# Patient Record
Sex: Female | Born: 1935 | Race: White | Hispanic: No | Smoking: Never smoker
Health system: Southern US, Community
[De-identification: ages and names within clinical notes are randomized; demographics above are authoritative.]

## PROBLEM LIST (undated history)

## (undated) DIAGNOSIS — H409 Unspecified glaucoma: Secondary | ICD-10-CM

## (undated) DIAGNOSIS — E782 Mixed hyperlipidemia: Secondary | ICD-10-CM

## (undated) DIAGNOSIS — R001 Bradycardia, unspecified: Secondary | ICD-10-CM

## (undated) DIAGNOSIS — G459 Transient cerebral ischemic attack, unspecified: Secondary | ICD-10-CM

## (undated) DIAGNOSIS — F0390 Unspecified dementia without behavioral disturbance: Secondary | ICD-10-CM

## (undated) HISTORY — PX: CHOLECYSTECTOMY: SHX55

## (undated) HISTORY — PX: ABDOMINAL HYSTERECTOMY: SHX81

## (undated) HISTORY — DX: Transient cerebral ischemic attack, unspecified: G45.9

---

## 1997-10-26 ENCOUNTER — Other Ambulatory Visit: Admission: RE | Admit: 1997-10-26 | Discharge: 1997-10-26 | Payer: Self-pay | Admitting: Internal Medicine

## 1998-07-19 ENCOUNTER — Ambulatory Visit (HOSPITAL_COMMUNITY): Admission: RE | Admit: 1998-07-19 | Discharge: 1998-07-19 | Payer: Self-pay | Admitting: Internal Medicine

## 1999-11-19 ENCOUNTER — Other Ambulatory Visit: Admission: RE | Admit: 1999-11-19 | Discharge: 1999-11-19 | Payer: Self-pay | Admitting: Internal Medicine

## 2002-02-03 ENCOUNTER — Other Ambulatory Visit: Admission: RE | Admit: 2002-02-03 | Discharge: 2002-02-03 | Payer: Self-pay | Admitting: Family Medicine

## 2004-03-12 ENCOUNTER — Other Ambulatory Visit: Admission: RE | Admit: 2004-03-12 | Discharge: 2004-03-12 | Payer: Self-pay | Admitting: Family Medicine

## 2005-03-18 ENCOUNTER — Other Ambulatory Visit: Admission: RE | Admit: 2005-03-18 | Discharge: 2005-03-18 | Payer: Self-pay | Admitting: Family Medicine

## 2011-03-11 ENCOUNTER — Other Ambulatory Visit (HOSPITAL_COMMUNITY): Payer: Self-pay | Admitting: Family Medicine

## 2011-03-11 DIAGNOSIS — Z1231 Encounter for screening mammogram for malignant neoplasm of breast: Secondary | ICD-10-CM

## 2011-03-13 ENCOUNTER — Other Ambulatory Visit (HOSPITAL_COMMUNITY): Payer: Self-pay | Admitting: Family Medicine

## 2011-03-13 DIAGNOSIS — Z1231 Encounter for screening mammogram for malignant neoplasm of breast: Secondary | ICD-10-CM

## 2011-03-22 ENCOUNTER — Other Ambulatory Visit (HOSPITAL_COMMUNITY): Payer: Self-pay | Admitting: Family Medicine

## 2011-03-22 ENCOUNTER — Ambulatory Visit (HOSPITAL_COMMUNITY)
Admission: RE | Admit: 2011-03-22 | Discharge: 2011-03-22 | Disposition: A | Discharge status: Home / Self Care | Payer: Medicare Other | Source: Ambulatory Visit | Attending: Family Medicine | Admitting: Family Medicine

## 2011-03-22 DIAGNOSIS — Z1231 Encounter for screening mammogram for malignant neoplasm of breast: Secondary | ICD-10-CM

## 2011-04-16 ENCOUNTER — Ambulatory Visit (HOSPITAL_COMMUNITY): Payer: Medicare Other

## 2011-04-18 ENCOUNTER — Ambulatory Visit (HOSPITAL_COMMUNITY): Payer: Medicare Other

## 2011-04-18 ENCOUNTER — Ambulatory Visit (HOSPITAL_COMMUNITY)
Admission: RE | Admit: 2011-04-18 | Discharge: 2011-04-18 | Disposition: A | Discharge status: Home / Self Care | Payer: Medicare Other | Source: Ambulatory Visit | Attending: Family Medicine | Admitting: Family Medicine

## 2011-04-18 DIAGNOSIS — Z1231 Encounter for screening mammogram for malignant neoplasm of breast: Secondary | ICD-10-CM | POA: Insufficient documentation

## 2011-05-01 ENCOUNTER — Other Ambulatory Visit: Payer: Self-pay | Admitting: Family Medicine

## 2011-05-01 DIAGNOSIS — R928 Other abnormal and inconclusive findings on diagnostic imaging of breast: Secondary | ICD-10-CM

## 2011-05-30 ENCOUNTER — Ambulatory Visit
Admission: RE | Admit: 2011-05-30 | Discharge: 2011-05-30 | Disposition: A | Discharge status: Home / Self Care | Payer: Medicare Other | Source: Ambulatory Visit | Attending: Family Medicine | Admitting: Family Medicine

## 2011-05-30 DIAGNOSIS — R928 Other abnormal and inconclusive findings on diagnostic imaging of breast: Secondary | ICD-10-CM

## 2012-01-03 ENCOUNTER — Other Ambulatory Visit: Payer: Self-pay | Admitting: Family Medicine

## 2012-01-03 DIAGNOSIS — R413 Other amnesia: Secondary | ICD-10-CM

## 2012-01-08 ENCOUNTER — Emergency Department (HOSPITAL_COMMUNITY)
Admission: EM | Admit: 2012-01-08 | Discharge: 2012-01-09 | Disposition: A | Discharge status: Other Acute Inpatient Hospital | Payer: Medicare Other | Attending: Emergency Medicine | Admitting: Emergency Medicine

## 2012-01-08 ENCOUNTER — Emergency Department (HOSPITAL_COMMUNITY): Payer: Medicare Other

## 2012-01-08 ENCOUNTER — Encounter (HOSPITAL_COMMUNITY): Payer: Self-pay

## 2012-01-08 ENCOUNTER — Ambulatory Visit
Admission: RE | Admit: 2012-01-08 | Discharge: 2012-01-08 | Disposition: A | Discharge status: Home / Self Care | Payer: Medicare Other | Source: Ambulatory Visit | Attending: Family Medicine | Admitting: Family Medicine

## 2012-01-08 DIAGNOSIS — G319 Degenerative disease of nervous system, unspecified: Secondary | ICD-10-CM | POA: Insufficient documentation

## 2012-01-08 DIAGNOSIS — R413 Other amnesia: Secondary | ICD-10-CM

## 2012-01-08 DIAGNOSIS — E7800 Pure hypercholesterolemia, unspecified: Secondary | ICD-10-CM | POA: Insufficient documentation

## 2012-01-08 DIAGNOSIS — R4689 Other symptoms and signs involving appearance and behavior: Secondary | ICD-10-CM

## 2012-01-08 DIAGNOSIS — F603 Borderline personality disorder: Secondary | ICD-10-CM | POA: Insufficient documentation

## 2012-01-08 DIAGNOSIS — Z9089 Acquired absence of other organs: Secondary | ICD-10-CM | POA: Insufficient documentation

## 2012-01-08 DIAGNOSIS — F0390 Unspecified dementia without behavioral disturbance: Secondary | ICD-10-CM | POA: Insufficient documentation

## 2012-01-08 HISTORY — DX: Unspecified glaucoma: H40.9

## 2012-01-08 HISTORY — DX: Unspecified dementia, unspecified severity, without behavioral disturbance, psychotic disturbance, mood disturbance, and anxiety: F03.90

## 2012-01-08 LAB — CBC WITH DIFFERENTIAL/PLATELET
Basophils Absolute: 0.1 10*3/uL (ref 0.0–0.1)
Lymphocytes Relative: 25 % (ref 12–46)
Lymphs Abs: 2.1 10*3/uL (ref 0.7–4.0)
Neutro Abs: 5.5 10*3/uL (ref 1.7–7.7)
Neutrophils Relative %: 64 % (ref 43–77)
Platelets: 286 10*3/uL (ref 150–400)
RBC: 5.01 MIL/uL (ref 3.87–5.11)
RDW: 13.6 % (ref 11.5–15.5)
WBC: 8.6 10*3/uL (ref 4.0–10.5)

## 2012-01-08 LAB — COMPREHENSIVE METABOLIC PANEL
ALT: 15 U/L (ref 0–35)
AST: 24 U/L (ref 0–37)
Alkaline Phosphatase: 82 U/L (ref 39–117)
CO2: 26 mEq/L (ref 19–32)
GFR calc Af Amer: 88 mL/min — ABNORMAL LOW (ref >90)
GFR calc non Af Amer: 76 mL/min — ABNORMAL LOW (ref >90)
Glucose, Bld: 106 mg/dL — ABNORMAL HIGH (ref 70–99)
Potassium: 4.2 mEq/L (ref 3.5–5.1)
Sodium: 138 mEq/L (ref 135–145)

## 2012-01-08 LAB — RAPID URINE DRUG SCREEN, HOSP PERFORMED
Amphetamines: NOT DETECTED
Barbiturates: NOT DETECTED
Cocaine: NOT DETECTED
Opiates: NOT DETECTED
Tetrahydrocannabinol: NOT DETECTED

## 2012-01-08 LAB — URINALYSIS, ROUTINE W REFLEX MICROSCOPIC
Bilirubin Urine: NEGATIVE
Hgb urine dipstick: NEGATIVE
Ketones, ur: NEGATIVE mg/dL
Nitrite: NEGATIVE
Specific Gravity, Urine: 1.018 (ref 1.005–1.030)
Urobilinogen, UA: 0.2 mg/dL (ref 0.0–1.0)
pH: 7.5 (ref 5.0–8.0)

## 2012-01-08 MED ORDER — ACETAMINOPHEN 325 MG PO TABS: 650.0000 mg | ORAL_TABLET | ORAL | Status: DC | PRN

## 2012-01-08 MED ORDER — ONDANSETRON HCL 4 MG PO TABS: 4.0000 mg | ORAL_TABLET | Freq: Three times a day (TID) | ORAL | Status: DC | PRN

## 2012-01-08 MED ORDER — ALUM & MAG HYDROXIDE-SIMETH 200-200-20 MG/5ML PO SUSP: 30.0000 mL | ORAL | Status: DC | PRN
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
## 2012-01-08 NOTE — Progress Notes (Signed)
Pt is still pending a tele-psych consult. CSW contacted SOC to review the wait time and was quoted another 1hr until it could be completed. CSW has updated the RN and the pt's family. ACT updated and will continue following at this time.

## 2012-01-08 NOTE — ED Notes (Signed)
Pt went to bathroom.  A hat was placed to catch specimen.  Pt urinated in toilet avoiding hat.  Pt returned to room and given more water.

## 2012-01-08 NOTE — ED Notes (Signed)
Patient transported to X-ray 

## 2012-01-08 NOTE — ED Notes (Addendum)
Patient returned from xray.
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
## 2012-01-08 NOTE — ED Notes (Signed)
Social work at bedside.  
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
## 2012-01-08 NOTE — ED Notes (Signed)
Hosmer MD allowed pt to eat and drink.

## 2012-01-09 ENCOUNTER — Emergency Department (HOSPITAL_COMMUNITY): Payer: Medicare Other

## 2012-01-09 MED ORDER — ATORVASTATIN CALCIUM 10 MG PO TABS
10.0000 mg | ORAL_TABLET | Freq: Every day | ORAL | Status: DC
  Administered 2012-01-09: 10 mg via ORAL
  Filled 2012-01-09: qty 1

## 2012-01-09 MED ORDER — QUETIAPINE FUMARATE 25 MG PO TABS
25.0000 mg | ORAL_TABLET | Freq: Every day | ORAL | Status: DC
  Administered 2012-01-09 (×2): 25 mg via ORAL
  Filled 2012-01-09 (×2): qty 1

## 2012-01-09 MED ORDER — LORAZEPAM 0.5 MG PO TABS: 0.5000 mg | ORAL_TABLET | Freq: Three times a day (TID) | ORAL | Status: DC

## 2012-01-09 MED ORDER — ADULT MULTIVITAMIN W/MINERALS CH
1.0000 | ORAL_TABLET | Freq: Every day | ORAL | Status: DC
  Administered 2012-01-09: 1 via ORAL
  Filled 2012-01-09: qty 1

## 2012-01-09 MED ORDER — OMEGA-3-ACID ETHYL ESTERS 1 G PO CAPS
2.0000 g | ORAL_CAPSULE | Freq: Every day | ORAL | Status: DC
  Administered 2012-01-09: 2 g via ORAL
  Filled 2012-01-09: qty 2

## 2012-01-09 MED ORDER — ZIPRASIDONE MESYLATE 20 MG IM SOLR
10.0000 mg | Freq: Once | INTRAMUSCULAR | Status: AC
  Administered 2012-01-09: 10 mg via INTRAMUSCULAR
  Filled 2012-01-09: qty 20

## 2012-01-09 MED ORDER — ALPRAZOLAM 0.25 MG PO TABS
0.2500 mg | ORAL_TABLET | Freq: Once | ORAL | Status: AC
  Administered 2012-01-09: 0.25 mg via ORAL
  Filled 2012-01-09: qty 1

## 2012-01-09 MED ORDER — GLUCOSAMINE 500 MG PO CAPS: 1.0000 | ORAL_CAPSULE | Freq: Every day | ORAL | Status: DC

## 2012-01-09 NOTE — ED Notes (Signed)
Dr. Rhunette Croft finally reached, informed of the situation, informed that chest xray has been ordered with permission with Charge RN. Dr. Rhunette Croft agreed to come and evaluate this patient.

## 2012-01-09 NOTE — Progress Notes (Signed)
PHARMACIST - PHYSICIAN ORDER COMMUNICATION  CONCERNING: P&T Medication Policy on Herbal Medications  DESCRIPTION:  This patient's order for:  Glucosamine has been noted.  This product(s) is classified as an "herbal" or natural product. Due to a lack of definitive safety studies or FDA approval, nonstandard manufacturing practices, plus the potential risk of unknown drug-drug interactions while on inpatient medications, the Pharmacy and Therapeutics Committee does not permit the use of "herbal" or natural products of this type within [facility].   ACTION TAKEN: The pharmacy department is unable to verify this order at this time. Please reevaluate patient's clinical condition at discharge and address if the herbal or natural product(s) should be resumed at that time.  Charolotte Eke, PharmD, pager [phone]. 01/09/2012,7:57 AM.

## 2012-01-09 NOTE — ED Notes (Addendum)
Pt was eating dinner, the staff heard pt coughing. The coughing became continues, pt spiting up mucous. Pt sts she had a piece of the biscuit.  Pt breath sound is clear, Aspiration cannot be rule out, Pt may possibly have food impaction, pt continues to spit up mucus with every bite of food she intake. calling EDP Dr. Rhunette Croft to see this patient. Dr. Rhunette Croft cannot be reach, several ED staff apparently has been looking for this doc as well. Charge RN has been informed. [place] finally got a hold of [facility] and Dr. Rhunette Croft is suppose to give me a call ASAP

## 2012-01-09 NOTE — ED Provider Notes (Signed)
History     CSN: [id number]  Arrival date & time 01/08/12  1628   First MD Initiated Contact with Patient 01/08/12 1820      Chief Complaint  Patient presents with  . Aggressive Behavior  . Dementia    (Consider location/radiation/quality/duration/timing/severity/associated sxs/prior treatment) HPI  Past Medical History  Diagnosis Date  . Dementia   . High cholesterol   . Glaucoma     Past Surgical History  Procedure Date  . Abdominal hysterectomy   . Cholecystectomy     History reviewed. No pertinent family history.  History  Substance Use Topics  . Smoking status: Never Smoker   . Smokeless tobacco: Not on file  . Alcohol Use: No    OB History    Grav Para Term Preterm Abortions TAB SAB Ect Mult Living                  Review of Systems  Allergies  Review of patient's allergies indicates no known allergies.  Home Medications   Current Outpatient Rx  Name Route Sig Dispense Refill  . ATORVASTATIN CALCIUM 10 MG PO TABS Oral Take 10 mg by mouth daily.    Marland Kitchen GLUCOSAMINE 500 MG PO CAPS Oral Take 1-2 capsules by mouth daily.    Marland Kitchen LORAZEPAM 0.5 MG PO TABS Oral Take 0.5 mg by mouth every 8 (eight) hours. Anxiety    . MULTI-VITAMIN/MINERALS PO TABS Oral Take 1 tablet by mouth daily.    . OMEGA-3-ACID ETHYL ESTERS 1 G PO CAPS Oral Take 2 g by mouth daily.      BP 145/76  Pulse 77  Temp 97.8 F (36.6 C) (Oral)  Resp 16  SpO2 96%  Physical Exam  ED Course  Procedures (including critical care time)  Labs Reviewed  COMPREHENSIVE METABOLIC PANEL - Abnormal; Notable for the following:    Glucose, Bld 106 (*)     GFR calc non Af Amer 76 (*)     GFR calc Af Amer 88 (*)     All other components within normal limits  URINALYSIS, ROUTINE W REFLEX MICROSCOPIC - Abnormal; Notable for the following:    APPearance TURBID (*)     Leukocytes, UA MODERATE (*)     All other components within normal limits  CBC WITH DIFFERENTIAL  ETHANOL  URINE RAPID DRUG  SCREEN (HOSP PERFORMED)  URINE MICROSCOPIC-ADD ON   Dg Chest 2 View  01/08/2012  *RADIOLOGY REPORT*  Clinical Data: Altered mental status.  CHEST - 2 VIEW  Comparison: None.  Findings: Heart size is upper limits normal.  The lungs are free of focal consolidations and pleural effusions.  No edema.  Mildly prominent interstitial markings are likely chronic.  Degenerative changes are seen in the spine.  Surgical clips are present in the right upper quadrant of the abdomen.  IMPRESSION: No evidence for acute  abnormality.  Original Report Authenticated By: Patterson Hammersmith, M.D.   Mr Brain Wo Contrast  01/08/2012  *RADIOLOGY REPORT*  Clinical Data: Memory loss  MRI HEAD WITHOUT CONTRAST  Technique:  Multiplanar, multiecho pulse sequences of the brain and surrounding structures were obtained according to standard protocol without intravenous contrast.  Comparison: None.  Findings: Age appropriate atrophy.  Chronic infarct right parietal cortex.  Chronic infarct right frontal lobe.  Mild chronic microvascular ischemia in the white matter.  Negative for acute infarct.  Negative for hemorrhage or mass lesion.  Brainstem and cerebellum are intact.  Paranasal sinuses are clear.  Vessels at  the base of the brain are patent.  IMPRESSION: Age appropriate atrophy.  Chronic microvascular ischemia in the white matter.  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  No acute infarct or mass.  Original Report Authenticated By: Camelia Phenes, M.D.     1. Dementia   2. Aggressive behavior       MDM  Pt stable, NAD.  She is resting comfortably, no acute events reported.  VS, medications, and nursing notes reviewed.  Pt is awaiting psych admission for further mgmnt of dementia.        Tobin Chad, MD 01/09/12 [phone]

## 2012-01-09 NOTE — ED Notes (Signed)
Charge RN called again to get permission to order chest xray. Chest xray ordered.

## 2012-01-09 NOTE — ED Notes (Signed)
Pt has been accepted to [facility] medical center, sheriff has been called and left a message. Waiting to hear back from the sheriff.

## 2012-01-09 NOTE — ED Notes (Signed)
Telepsych completed.  

## 2012-01-09 NOTE — ED Notes (Signed)
Dr. Rhunette Croft came, and evaluated this patient. Pt transported to xray

## 2012-01-09 NOTE — Progress Notes (Signed)
CSW spoke with Amy Wilkinson at [facility] who advised patient has been accepted.    CSW called daughter who reported not having a medical power of attorney. CSW explained IVC process to them to which she agreed.  Patient has been IVCed and pending accepting physician once discharges occur at [facility].   Manson Passey Aniruddh Ciavarella ANN S , MSW, LCSWA 01/09/2012 12:32 PM [phone]

## 2012-01-09 NOTE — ED Notes (Signed)
Family visiting patient in room

## 2012-01-09 NOTE — Progress Notes (Signed)
Per Delorise Shiner at [facility], pt has been accepted to their facility by Dr. Lowanda Foster. EDP notified and is in agreement with the disposition. RN made aware to call report and to call for transport. Pt is under IVC and will need to transport via sheriff.

## 2012-01-09 NOTE — Progress Notes (Signed)
Pt given Seroquel 25mg  PO at 2040. Sheriff Penrod for transport to [employer]. Pt belongings removed from locker and transported with officer and female escort. Report called and given to April at the [facility]. Pt dressed in street clothes and wore glasses, was okay per officer Penrod.

## 2012-02-05 ENCOUNTER — Emergency Department (HOSPITAL_COMMUNITY)
Admission: EM | Admit: 2012-02-05 | Discharge: 2012-02-06 | Disposition: A | Discharge status: Home / Self Care | Payer: Medicare Other | Attending: Emergency Medicine | Admitting: Emergency Medicine

## 2012-02-05 DIAGNOSIS — F068 Other specified mental disorders due to known physiological condition: Secondary | ICD-10-CM | POA: Insufficient documentation

## 2012-02-05 DIAGNOSIS — S0990XA Unspecified injury of head, initial encounter: Secondary | ICD-10-CM

## 2012-02-05 DIAGNOSIS — Y9210 Unspecified residential institution as the place of occurrence of the external cause: Secondary | ICD-10-CM | POA: Insufficient documentation

## 2012-02-05 DIAGNOSIS — W010XXA Fall on same level from slipping, tripping and stumbling without subsequent striking against object, initial encounter: Secondary | ICD-10-CM | POA: Insufficient documentation

## 2012-02-05 DIAGNOSIS — R51 Headache: Secondary | ICD-10-CM | POA: Insufficient documentation

## 2012-02-05 DIAGNOSIS — H409 Unspecified glaucoma: Secondary | ICD-10-CM | POA: Insufficient documentation

## 2012-02-05 DIAGNOSIS — S0100XA Unspecified open wound of scalp, initial encounter: Secondary | ICD-10-CM | POA: Insufficient documentation

## 2012-02-05 DIAGNOSIS — S0003XA Contusion of scalp, initial encounter: Secondary | ICD-10-CM | POA: Insufficient documentation

## 2012-02-05 DIAGNOSIS — E7800 Pure hypercholesterolemia, unspecified: Secondary | ICD-10-CM | POA: Insufficient documentation

## 2012-02-05 DIAGNOSIS — S0101XA Laceration without foreign body of scalp, initial encounter: Secondary | ICD-10-CM

## 2012-02-05 DIAGNOSIS — Z79899 Other long term (current) drug therapy: Secondary | ICD-10-CM | POA: Insufficient documentation

## 2012-02-05 NOTE — ED Notes (Signed)
Pt fell and hit her head tonight at [facility] where she lives. Pt is oriented per her baseline. Laceration on back R side of her head. L elbow abrasion. No blood thinners. NAD.

## 2012-02-05 NOTE — ED Notes (Signed)
ZOX:[id number]<AV> Expected date:02/05/12<BR> Expected time:10:58 PM<BR> Means of arrival:Ambulance<BR> Comments:<BR> Fall

## 2012-02-06 ENCOUNTER — Encounter (HOSPITAL_COMMUNITY): Payer: Self-pay | Admitting: Radiology

## 2012-02-06 ENCOUNTER — Emergency Department (HOSPITAL_COMMUNITY): Payer: Medicare Other

## 2012-02-06 NOTE — ED Notes (Signed)
PTAR notified.  ?

## 2012-02-06 NOTE — ED Provider Notes (Signed)
History     CSN: [id number]  Arrival date & time 02/05/12  2304   First MD Initiated Contact with Patient 02/06/12 0050      Chief Complaint  Patient presents with  . LSB   . Fall    (Consider location/radiation/quality/duration/timing/severity/associated sxs/prior treatment) HPI Comments: jsut pta, pt fell and struck the R side of her head above the ear when she fell causing pain and laceration.  ? LOC.  The patient is from [facility]. She was immobilized with a backboard and a cervical collar prior to arrival. She has baseline mental status for her per EMS and nursing staff. The patient is not on any anticoagulation and she does have dementia. The patient states that she was in the garage when she fell, this is not what happened.  Patient is a 78 y.o. female presenting with fall. The history is provided by the patient and the EMS personnel. History Limited By: Level 5 caveat applies for dementia and memory loss.  Fall    Past Medical History  Diagnosis Date  . Dementia   . High cholesterol   . Glaucoma     Past Surgical History  Procedure Date  . Abdominal hysterectomy   . Cholecystectomy     No family history on file.  History  Substance Use Topics  . Smoking status: Never Smoker   . Smokeless tobacco: Not on file  . Alcohol Use: No    OB History    Grav Para Term Preterm Abortions TAB SAB Ect Mult Living                  Review of Systems  Unable to perform ROS   Allergies  Review of patient's allergies indicates no known allergies.  Home Medications   Current Outpatient Rx  Name Route Sig Dispense Refill  . BISACODYL 5 MG PO TBEC Oral Take 5 mg by mouth at bedtime.    Marland Kitchen LORAZEPAM 0.5 MG PO TABS Oral Take 0.5 mg by mouth every 8 (eight) hours. Anxiety    . QUETIAPINE FUMARATE 100 MG PO TABS Oral Take 100 mg by mouth at bedtime.      BP 123/57  Pulse 87  Temp 98 F (36.7 C) (Oral)  Resp 20  SpO2 94%  Physical Exam  Nursing  note and vitals reviewed. Constitutional: She appears well-developed and well-nourished. No distress.  HENT:  Head: Normocephalic.  Mouth/Throat: Oropharynx is clear and moist. No oropharyngeal exudate.       Tympanic membranes clear bilaterally, right temporal parietal scalp with hematoma and 3 cm laceration  Eyes: Conjunctivae and EOM are normal. Pupils are equal, round, and reactive to light. Right eye exhibits no discharge. Left eye exhibits no discharge. No scleral icterus.  Neck: No JVD present. No thyromegaly present.  Cardiovascular: Normal rate, regular rhythm, normal heart sounds and intact distal pulses.  Exam reveals no gallop and no friction rub.   No murmur heard. Pulmonary/Chest: Effort normal and breath sounds normal. No respiratory distress. She has no wheezes. She has no rales.  Abdominal: Soft. Bowel sounds are normal. She exhibits no distension and no mass. There is no tenderness.  Musculoskeletal: Normal range of motion. She exhibits no edema and no tenderness.  Lymphadenopathy:    She has no cervical adenopathy.  Neurological: She is alert. Coordination normal.       Moves all extremities x4, follows commands, memory loss consistent with history of dementia  Skin: Skin is warm and  dry. No rash noted. No erythema.  Psychiatric: She has a normal mood and affect. Her behavior is normal.    ED Course  Procedures (including critical care time)  Labs Reviewed - No data to display Ct Head Wo Contrast  02/06/2012  *RADIOLOGY REPORT*  Clinical Data: Severe headache  CT HEAD WITHOUT CONTRAST,CT CERVICAL SPINE WITHOUT CONTRAST  Technique:  Contiguous axial images were obtained from the base of the skull through the vertex without contrast.,Technique: Multidetector CT imaging of the cervical spine was performed. Multiplanar CT image reconstructions were also generated.  Comparison: 01/08/2012 MRI  Findings: Areas of hypoattenuation within the right frontal and right parietal lobe,  in keeping with sequelae of prior infarction. Prominence of the sulci, cisterns, and ventricles, in keeping with volume loss. There are subcortical and periventricular white matter hypodensities, a nonspecific finding most often seen with chronic microangiopathic changes.  There is no evidence for acute hemorrhage, overt hydrocephalus, mass lesion, or abnormal extra-axial fluid collection.  No definite CT evidence for acute cortical based (large artery) infarction. Right lateral scalp hematoma.  Overlying skin staples.  No underlying calvarial fracture.  IMPRESSION: White matter changes and sequelae of prior infarctions. No definite evidence of acute intracranial abnormality.  Right lateral scalp hematoma/laceration.  Underlying calvarial fracture.  Cervical spine:  Lung apices are predominately clear.  Maintained craniocervical relationship.  No dens fracture.  Multilevel degenerative changes.  Degenerative disc disease most pronounced at C5-6. Subtle lucency through the right C5 transverse foramen without displacement. Otherwise, no acute fracture or dislocation identified.  Paravertebral soft tissues within normal limits.  Heterogeneous attenuation left thyroid nodule with punctate calcification.  Impression:  Multilevel degenerative changes.  Subtle lucency through the right C5 transverse foramen without displacement. Correlate with point tenderness.  May correspond to an age indeterminate fracture though favored remote given the corticated edges.  Otherwise, no acute fracture or dislocation of the cervical spine identified.  Indeterminate left thyroid lobe nodule. Consider non emergent ultrasound follow-up.   Original Report Authenticated By: Waneta Martins, M.D.    Ct Cervical Spine Wo Contrast  02/06/2012  *RADIOLOGY REPORT*  Clinical Data: Severe headache  CT HEAD WITHOUT CONTRAST,CT CERVICAL SPINE WITHOUT CONTRAST  Technique:  Contiguous axial images were obtained from the base of the skull through  the vertex without contrast.,Technique: Multidetector CT imaging of the cervical spine was performed. Multiplanar CT image reconstructions were also generated.  Comparison: 01/08/2012 MRI  Findings: Areas of hypoattenuation within the right frontal and right parietal lobe, in keeping with sequelae of prior infarction. Prominence of the sulci, cisterns, and ventricles, in keeping with volume loss. There are subcortical and periventricular white matter hypodensities, a nonspecific finding most often seen with chronic microangiopathic changes.  There is no evidence for acute hemorrhage, overt hydrocephalus, mass lesion, or abnormal extra-axial fluid collection.  No definite CT evidence for acute cortical based (large artery) infarction. Right lateral scalp hematoma.  Overlying skin staples.  No underlying calvarial fracture.  IMPRESSION: White matter changes and sequelae of prior infarctions. No definite evidence of acute intracranial abnormality.  Right lateral scalp hematoma/laceration.  Underlying calvarial fracture.  Cervical spine:  Lung apices are predominately clear.  Maintained craniocervical relationship.  No dens fracture.  Multilevel degenerative changes.  Degenerative disc disease most pronounced at C5-6. Subtle lucency through the right C5 transverse foramen without displacement. Otherwise, no acute fracture or dislocation identified.  Paravertebral soft tissues within normal limits.  Heterogeneous attenuation left thyroid nodule with punctate calcification.  Impression:  Multilevel degenerative changes.  Subtle lucency through the right C5 transverse foramen without displacement. Correlate with point tenderness.  May correspond to an age indeterminate fracture though favored remote given the corticated edges.  Otherwise, no acute fracture or dislocation of the cervical spine identified.  Indeterminate left thyroid lobe nodule. Consider non emergent ultrasound follow-up.   Original Report Authenticated By:  Waneta Martins, M.D.      1. Head injury   2. Laceration of scalp       MDM  There is not appear to be any tenderness in her 4 extremities including her hip, she has full range of motion of bilateral lower extremities. She doesn't head injury requiring staple closure, CT of the head due to this hematoma.  CT of the cervical spine secondary to age and head injury. Immobilization of the cervical spine maintained. The patient has a normal exam otherwise, injury closed with staples after irrigation.  LACERATION REPAIR Performed by: Vida Roller Authorized by: Vida Roller Consent: Verbal consent obtained. Risks and benefits: risks, benefits and alternatives were discussed Consent given by: patient Patient identity confirmed: provided demographic data Prepped and Draped in normal sterile fashion Wound explored  Laceration Location: Right temporal scalp  Laceration Length: 3 cm  No Foreign Bodies seen or palpated  Anesthesia: local infiltration  Local anesthetic none   Irrigation method: syringe Amount of cleaning: standard  Skin closure: Stable   Number of sutures: 3   Technique: Staples  Patient tolerance: Patient tolerated the procedure well with no immediate complications.   I have discussed the findings of the CT scan after my partial interpretation showing some abnormalities in the transverse brain of the C5 vertebra of the neck however after my interpretation and the radiologist's interpretation we believe that this is not a new injury. In addition to this finding the patient is not tender on her neck at all.  CT scan of the brain read as negative, CT scan of the neck as above, fevers old healed injury. The patient has no focal neurologic symptoms and has no neck pain nor did she have point tenderness. Cervical collar removed, patient to be discharged home in improved condition for staple removal in 7 days.    Vida Roller, MD 02/06/12 [phone]

## 2012-03-19 ENCOUNTER — Encounter (HOSPITAL_COMMUNITY): Payer: Self-pay | Admitting: Emergency Medicine

## 2012-03-19 ENCOUNTER — Emergency Department (HOSPITAL_COMMUNITY): Payer: Medicare Other

## 2012-03-19 ENCOUNTER — Emergency Department (HOSPITAL_COMMUNITY)
Admission: EM | Admit: 2012-03-19 | Discharge: 2012-03-19 | Disposition: A | Discharge status: Skilled Nursing Facility | Payer: Medicare Other | Attending: Emergency Medicine | Admitting: Emergency Medicine

## 2012-03-19 DIAGNOSIS — Z79899 Other long term (current) drug therapy: Secondary | ICD-10-CM | POA: Insufficient documentation

## 2012-03-19 DIAGNOSIS — W19XXXA Unspecified fall, initial encounter: Secondary | ICD-10-CM | POA: Insufficient documentation

## 2012-03-19 DIAGNOSIS — F0390 Unspecified dementia without behavioral disturbance: Secondary | ICD-10-CM | POA: Insufficient documentation

## 2012-03-19 DIAGNOSIS — S61509A Unspecified open wound of unspecified wrist, initial encounter: Secondary | ICD-10-CM | POA: Insufficient documentation

## 2012-03-19 DIAGNOSIS — R51 Headache: Secondary | ICD-10-CM | POA: Insufficient documentation

## 2012-03-19 DIAGNOSIS — M25519 Pain in unspecified shoulder: Secondary | ICD-10-CM | POA: Insufficient documentation

## 2012-03-19 DIAGNOSIS — S41119A Laceration without foreign body of unspecified upper arm, initial encounter: Secondary | ICD-10-CM

## 2012-03-19 DIAGNOSIS — Y92009 Unspecified place in unspecified non-institutional (private) residence as the place of occurrence of the external cause: Secondary | ICD-10-CM | POA: Insufficient documentation

## 2012-03-19 DIAGNOSIS — M542 Cervicalgia: Secondary | ICD-10-CM | POA: Insufficient documentation

## 2012-03-19 MED ORDER — TETANUS-DIPHTH-ACELL PERTUSSIS 5-2.5-18.5 LF-MCG/0.5 IM SUSP
0.5000 mL | Freq: Once | INTRAMUSCULAR | Status: AC
  Administered 2012-03-19: 0.5 mL via INTRAMUSCULAR
  Filled 2012-03-19: qty 0.5

## 2012-03-19 NOTE — ED Provider Notes (Signed)
History     CSN: [id number]  Arrival date & time 03/19/12  1024   First MD Initiated Contact with Patient 03/19/12 1031      Chief Complaint  Patient presents with  . Fall    (Consider location/radiation/quality/duration/timing/severity/associated sxs/prior treatment) HPI  78 y.o. female with dementia sent from nursing home for fall earlier in the day. His verbal and denies any pain, or vomiting at this time. Level V caveat secondary to dementia.  Past Medical History  Diagnosis Date  . Dementia   . High cholesterol   . Glaucoma     Past Surgical History  Procedure Date  . Abdominal hysterectomy   . Cholecystectomy     No family history on file.  History  Substance Use Topics  . Smoking status: Never Smoker   . Smokeless tobacco: Not on file  . Alcohol Use: No    OB History    Grav Para Term Preterm Abortions TAB SAB Ect Mult Living                  Review of Systems  Unable to perform ROS: Dementia    Allergies  Review of patient's allergies indicates no known allergies.  Home Medications   Current Outpatient Rx  Name Route Sig Dispense Refill  . ACETAMINOPHEN 325 MG PO TABS Oral Take 650 mg by mouth every 4 (four) hours as needed. pain    . BISACODYL 5 MG PO TBEC Oral Take 5 mg by mouth at bedtime.    . DONEPEZIL HCL 10 MG PO TABS Oral Take 10 mg by mouth at bedtime.    . ENSURE PO LIQD Oral Take 237 mLs by mouth 3 (three) times daily between meals.    . IBUPROFEN 400 MG PO TABS Oral Take 400 mg by mouth every 8 (eight) hours as needed. pain    . LORAZEPAM 0.5 MG PO TABS Oral Take 0.5 mg by mouth every 8 (eight) hours. Anxiety    . MIRTAZAPINE 15 MG PO TABS Oral Take 15 mg by mouth See admin instructions. 15 mg by mouth at bedtime starting 03/25/12    . QUETIAPINE FUMARATE 50 MG PO TABS Oral Take 50 mg by mouth See admin instructions. Take 1 tablet by mouth at bedtime for 7 days starting 03/18/12 then stop seroquel and start remeron 15mg  1 tablet at  bedtime    . VENLAFAXINE HCL 37.5 MG PO TABS Oral Take 75 mg by mouth at bedtime.      BP 159/90  Pulse 90  Temp 97.9 F (36.6 C) (Oral)  Resp 16  SpO2 91%  Physical Exam  Nursing note and vitals reviewed. Constitutional: She appears well-developed and well-nourished. No distress.  HENT:  Head: Normocephalic.  Eyes: Conjunctivae normal and EOM are normal. Pupils are equal, round, and reactive to light.  Neck: Normal range of motion.  Cardiovascular: Normal rate.   Pulmonary/Chest: Effort normal and breath sounds normal. No stridor.  Abdominal: Soft. Bowel sounds are normal.  Musculoskeletal: Normal range of motion.  Neurological: She is alert.  Skin:       Partial thickness laceration to left wrist ~ 8 cm long.   Psychiatric: She has a normal mood and affect.    ED Course  Procedures (including critical care time)  Labs Reviewed - No data to display Dg Shoulder Right  03/19/2012  *RADIOLOGY REPORT*  Clinical Data: Pain post trauma  RIGHT SHOULDER - 2+ VIEW  Comparison: None.  Findings:  Internal rotation,  external rotation, and Y scapular views were obtained.  There is mild generalized osteoarthritis.  No fracture or dislocation.  No erosive change. There is a large pleural effusion on the right.  IMPRESSION: No fracture or dislocation. Large right-sided pleural effusion.   Original Report Authenticated By: Arvin Collard. WOODRUFF III, M.D.    Dg Wrist Complete Left  03/19/2012  *RADIOLOGY REPORT*  Clinical Data: Pain post trauma  LEFT WRIST - COMPLETE 3+ VIEW  Comparison: None.  Findings:  Frontal, oblique, lateral, and ulnar deviation scaphoid images were obtained.  Bones are osteoporotic.  No fracture or dislocation.  There is marked osteoarthritic change in the scaphotrapezial joint and in the first MCP joint. There is mild osteoarthritic change in the saddle joint.  There is no bony destruction.  IMPRESSION: Areas of osteoarthritic change.  Generalized osteoporosis.  No  fracture or dislocation.   Original Report Authenticated By: Arvin Collard. WOODRUFF III, M.D.    Ct Head Wo Contrast  03/19/2012  *RADIOLOGY REPORT*  Clinical Data: Fall, severe headache.  CT HEAD WITHOUT CONTRAST  Technique:  Contiguous axial images were obtained from the base of the skull through the vertex without contrast.  Comparison: 02/06/2012  Findings: Old right frontal infarct, stable. There is atrophy and chronic small vessel disease changes. No acute intracranial abnormality.  Specifically, no hemorrhage, hydrocephalus, mass lesion, acute infarction, or significant intracranial injury.  No acute calvarial abnormality. Visualized paranasal sinuses and mastoids clear.  Orbital soft tissues unremarkable.  IMPRESSION: No acute intracranial abnormality.   Original Report Authenticated By: Cyndie Chime, M.D.    Ct Cervical Spine Wo Contrast  03/19/2012  *RADIOLOGY REPORT*  Clinical Data: Pain post trauma  CT CERVICAL SPINE WITHOUT CONTRAST  Technique:  Multidetector CT imaging of the cervical spine was performed. Multiplanar CT image reconstructions were also generated.  Comparison: February 06, 2012  Findings:  There is no fracture or spondylolisthesis.  Prevertebral soft tissues and predental space regions are normal.  There is moderately severe disc space narrowing at C5-6.  There is moderate narrowing at C3-4.  There is milder narrowing at the other levels.  There is multilevel facet hypertrophy.  There is no disc extrusion or stenosis.  There is a large layering pleural effusion on the right.  There is stable multinodular goiter with what appears to be a dominant mass in the left lobe of the thyroid.   IMPRESSION:  Spondylosis.  No fracture or spondylolisthesis.  Apparent large pleural effusion on the right.  Stable enlarged thyroid with masses, largest on the left.  This finding may warrant correlation with thyroid ultrasound to more precisely assess the lesions in the thyroid.   Original Report  Authenticated By: Arvin Collard. WOODRUFF III, M.D.      1. Fall   2. Laceration of arm       MDM  78 y/o demented female s/p fall, Pt can follow commands reasonably and has 5/5 strength x4 extremities with PERRL. Head and neck CT show no fractures of intracranial bleeds. XR of shoulder and wrist also show no bony abnormalities.   Wounds are cleaned and closed with Tegaderm. Tetanus updated.    POST DC NOTE:   Spoke with patient's daughter Regis Bill at her cell phone [phone] to discuss the effusion as noted to the CT. Okey Regal states that her mother has had this effusion for several weeks she had a portable chest x-ray after her last fall and the effusion was noted by her primary care Dr. who  works with doctors making house calls lower back more tell her primary care doctor felt that this was secondary to a pneumonia and started her on antibiotics for such I have advised her daughter Okey Regal that the effusion is still present and she may bring her back to the emergency room will follow with her primary care doctor for further evaluation.        Wynetta Emery, PA-C 03/19/12 1727

## 2012-03-19 NOTE — ED Notes (Addendum)
Per EMS:  Pt comes from [facility] - was walking to cafeteria for breakfast.  Pt fell on right side. Struck right side of head on heater (per staff)... No obvious deformities noted per EMS.  Pt c/o rt shoulder and leg pain.  Pt has hx of shoulder pain - has had cortisone injections in the past.  Pt denies LOC.  Pt is alert and oriented.  Has abrasion to left forearm.

## 2012-03-19 NOTE — ED Notes (Signed)
Spoke with patients daughter on phone, her daughter has concerns regarding her mothers chronic conditions and whether this fall has worsened her condition. Informed provider of daughters concerns.

## 2012-03-19 NOTE — Progress Notes (Signed)
5:06 PM PT had been seen for a fall earlier in the day, and x-rays had shown no fracture, so Amy Wilkinson had been released back to her facility.  Rechecking her x-rays showed that in the reading of her right shoulder films and cervical spine both showed a pleural effusion.  Call to pt's daughter, who said that pt had had the effusion noted by her primary care doctor, who had given her antibiotics for pneumonia.  Pt's daughter was advised that the pleural effusion would need further workup, which could be done by her doctors at her facility or by having her return to the ED for this workup

## 2012-03-19 NOTE — ED Provider Notes (Signed)
Medical screening examination/treatment/procedure(s) were performed by non-physician practitioner and as supervising physician I was immediately available for consultation/collaboration. 5:06 PM  PT had been seen for a fall earlier in the day, and x-rays had shown no fracture, so she had been released back to her facility. Rechecking her x-rays showed that in the reading of her right shoulder films and cervical spine both showed a pleural effusion. Call to pt's daughter, who said that pt had had the effusion noted by her primary care doctor, who had given her antibiotics for pneumonia.  Pt's daughter was advised that the pleural effusion would need further workup, which could be done by her doctors at her facility or by having her return to the ED for this workup.       Carleene Cooper III, MD 03/19/12 [phone]

## 2012-03-19 NOTE — ED Notes (Signed)
Pt has remained in radiology and still has not returned to room at this time.

## 2012-03-27 ENCOUNTER — Other Ambulatory Visit (HOSPITAL_COMMUNITY): Payer: Self-pay | Admitting: *Deleted

## 2012-03-27 DIAGNOSIS — R222 Localized swelling, mass and lump, trunk: Secondary | ICD-10-CM

## 2012-03-31 ENCOUNTER — Ambulatory Visit (HOSPITAL_COMMUNITY)
Admission: RE | Admit: 2012-03-31 | Discharge: 2012-03-31 | Disposition: A | Discharge status: Home / Self Care | Payer: Medicare Other | Source: Ambulatory Visit | Attending: Family Medicine | Admitting: Family Medicine

## 2012-03-31 DIAGNOSIS — J9819 Other pulmonary collapse: Secondary | ICD-10-CM | POA: Insufficient documentation

## 2012-03-31 DIAGNOSIS — R222 Localized swelling, mass and lump, trunk: Secondary | ICD-10-CM

## 2012-04-15 ENCOUNTER — Other Ambulatory Visit: Payer: Self-pay

## 2012-04-15 DIAGNOSIS — E041 Nontoxic single thyroid nodule: Secondary | ICD-10-CM

## 2012-04-21 ENCOUNTER — Inpatient Hospital Stay
Admission: RE | Admit: 2012-04-21 | Discharge: 2012-04-21 | Disposition: A | Discharge status: Home / Self Care | Payer: Medicare Other | Source: Ambulatory Visit

## 2012-04-30 ENCOUNTER — Telehealth: Payer: Self-pay | Admitting: Critical Care Medicine

## 2012-04-30 ENCOUNTER — Other Ambulatory Visit: Payer: Medicare Other

## 2012-04-30 ENCOUNTER — Encounter: Payer: Self-pay | Admitting: Critical Care Medicine

## 2012-04-30 NOTE — Telephone Encounter (Signed)
Records have been received.  Will sign off as requested below.

## 2012-04-30 NOTE — Telephone Encounter (Signed)
I spoke with heather and she stated pt is going to be seen tomorrow as new consult. She is making sure that we did receive records they sent over on pt. Please advise crystal thanks.    ---these results were faxed over to triage fax #--if we did receive no call back needed

## 2012-05-01 ENCOUNTER — Institutional Professional Consult (permissible substitution): Payer: Medicare Other | Admitting: Critical Care Medicine

## 2012-05-19 ENCOUNTER — Institutional Professional Consult (permissible substitution): Payer: Medicare Other | Admitting: Critical Care Medicine

## 2012-06-30 ENCOUNTER — Institutional Professional Consult (permissible substitution): Payer: Medicare Other | Admitting: Critical Care Medicine

## 2012-08-30 ENCOUNTER — Emergency Department (HOSPITAL_COMMUNITY)
Admission: EM | Admit: 2012-08-30 | Discharge: 2012-08-30 | Disposition: A | Discharge status: Skilled Nursing Facility | Payer: Medicare Other | Attending: Emergency Medicine | Admitting: Emergency Medicine

## 2012-08-30 ENCOUNTER — Encounter (HOSPITAL_COMMUNITY): Payer: Self-pay | Admitting: Family Medicine

## 2012-08-30 DIAGNOSIS — Y9210 Unspecified residential institution as the place of occurrence of the external cause: Secondary | ICD-10-CM | POA: Insufficient documentation

## 2012-08-30 DIAGNOSIS — F0390 Unspecified dementia without behavioral disturbance: Secondary | ICD-10-CM | POA: Insufficient documentation

## 2012-08-30 DIAGNOSIS — S0990XA Unspecified injury of head, initial encounter: Secondary | ICD-10-CM | POA: Insufficient documentation

## 2012-08-30 DIAGNOSIS — Y9389 Activity, other specified: Secondary | ICD-10-CM | POA: Insufficient documentation

## 2012-08-30 DIAGNOSIS — Z8669 Personal history of other diseases of the nervous system and sense organs: Secondary | ICD-10-CM | POA: Insufficient documentation

## 2012-08-30 DIAGNOSIS — Z79899 Other long term (current) drug therapy: Secondary | ICD-10-CM | POA: Insufficient documentation

## 2012-08-30 DIAGNOSIS — Z8639 Personal history of other endocrine, nutritional and metabolic disease: Secondary | ICD-10-CM | POA: Insufficient documentation

## 2012-08-30 DIAGNOSIS — Z862 Personal history of diseases of the blood and blood-forming organs and certain disorders involving the immune mechanism: Secondary | ICD-10-CM | POA: Insufficient documentation

## 2012-08-30 DIAGNOSIS — Z8673 Personal history of transient ischemic attack (TIA), and cerebral infarction without residual deficits: Secondary | ICD-10-CM | POA: Insufficient documentation

## 2012-08-30 DIAGNOSIS — W1809XA Striking against other object with subsequent fall, initial encounter: Secondary | ICD-10-CM | POA: Insufficient documentation
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
## 2012-08-30 NOTE — ED Notes (Signed)
Pt fell this am and hit head on wall. No deformities, bruising or swelling. No complaints of pain. Daughter sent to eval from memory care at [facility]. BP 134/78. HR 80. 96& RA. CBG 97

## 2012-09-07 ENCOUNTER — Institutional Professional Consult (permissible substitution): Payer: Medicare Other | Admitting: Critical Care Medicine

## 2012-10-14 ENCOUNTER — Emergency Department (HOSPITAL_COMMUNITY): Payer: Medicare Other

## 2012-10-14 ENCOUNTER — Emergency Department (HOSPITAL_COMMUNITY)
Admission: EM | Admit: 2012-10-14 | Discharge: 2012-10-14 | Disposition: A | Discharge status: Home / Self Care | Payer: Medicare Other | Attending: Emergency Medicine | Admitting: Emergency Medicine

## 2012-10-14 ENCOUNTER — Encounter (HOSPITAL_COMMUNITY): Payer: Self-pay | Admitting: Emergency Medicine

## 2012-10-14 DIAGNOSIS — Z8639 Personal history of other endocrine, nutritional and metabolic disease: Secondary | ICD-10-CM | POA: Insufficient documentation

## 2012-10-14 DIAGNOSIS — F0390 Unspecified dementia without behavioral disturbance: Secondary | ICD-10-CM | POA: Insufficient documentation

## 2012-10-14 DIAGNOSIS — Z862 Personal history of diseases of the blood and blood-forming organs and certain disorders involving the immune mechanism: Secondary | ICD-10-CM | POA: Insufficient documentation

## 2012-10-14 DIAGNOSIS — Z8673 Personal history of transient ischemic attack (TIA), and cerebral infarction without residual deficits: Secondary | ICD-10-CM | POA: Insufficient documentation

## 2012-10-14 DIAGNOSIS — S8002XA Contusion of left knee, initial encounter: Secondary | ICD-10-CM

## 2012-10-14 DIAGNOSIS — Z79899 Other long term (current) drug therapy: Secondary | ICD-10-CM | POA: Insufficient documentation

## 2012-10-14 DIAGNOSIS — W010XXA Fall on same level from slipping, tripping and stumbling without subsequent striking against object, initial encounter: Secondary | ICD-10-CM | POA: Insufficient documentation

## 2012-10-14 DIAGNOSIS — S8000XA Contusion of unspecified knee, initial encounter: Secondary | ICD-10-CM | POA: Insufficient documentation

## 2012-10-14 DIAGNOSIS — Y9301 Activity, walking, marching and hiking: Secondary | ICD-10-CM | POA: Insufficient documentation

## 2012-10-14 DIAGNOSIS — S80212A Abrasion, left knee, initial encounter: Secondary | ICD-10-CM

## 2012-10-14 DIAGNOSIS — IMO0002 Reserved for concepts with insufficient information to code with codable children: Secondary | ICD-10-CM | POA: Insufficient documentation

## 2012-10-14 DIAGNOSIS — R42 Dizziness and giddiness: Secondary | ICD-10-CM | POA: Insufficient documentation

## 2012-10-14 DIAGNOSIS — H409 Unspecified glaucoma: Secondary | ICD-10-CM | POA: Insufficient documentation

## 2012-10-14 DIAGNOSIS — Z23 Encounter for immunization: Secondary | ICD-10-CM | POA: Insufficient documentation

## 2012-10-14 DIAGNOSIS — Y9210 Unspecified residential institution as the place of occurrence of the external cause: Secondary | ICD-10-CM | POA: Insufficient documentation

## 2012-10-14 LAB — CBC
HCT: 40.3 % (ref 36.0–46.0)
MCV: 81.7 fL (ref 78.0–100.0)
RBC: 4.93 MIL/uL (ref 3.87–5.11)
WBC: 7.6 10*3/uL (ref 4.0–10.5)

## 2012-10-14 LAB — URINALYSIS, ROUTINE W REFLEX MICROSCOPIC
Bilirubin Urine: NEGATIVE
Hgb urine dipstick: NEGATIVE
Nitrite: NEGATIVE
Specific Gravity, Urine: 1.012 (ref 1.005–1.030)
pH: 6 (ref 5.0–8.0)

## 2012-10-14 LAB — BASIC METABOLIC PANEL
CO2: 30 mEq/L (ref 19–32)
Chloride: 104 mEq/L (ref 96–112)
Sodium: 141 mEq/L (ref 135–145)

## 2012-10-14 MED ORDER — TETANUS-DIPHTH-ACELL PERTUSSIS 5-2.5-18.5 LF-MCG/0.5 IM SUSP
0.5000 mL | Freq: Once | INTRAMUSCULAR | Status: AC
  Administered 2012-10-14: 0.5 mL via INTRAMUSCULAR
  Filled 2012-10-14 (×2): qty 0.5

## 2012-10-14 NOTE — ED Notes (Signed)
Pt to Ed via EMS from skilled facility for fall. Pt states she was walking to bathroom and loss balance and fall to the left knee then hit her head to floor. No obvious injury noted. Pt denies taking blood thinners. Pt states she was quite awake at the time. Per EMS BP-142/80, pulse-80.

## 2012-10-14 NOTE — ED Notes (Signed)
[facility] contacted for transport 

## 2012-10-14 NOTE — ED Notes (Signed)
Pt knows that urine sample is needed. pt wet the bed. Pt and bed were cleaned up. Pt asked to let us know when she needs to void so that we can get sample

## 2012-10-14 NOTE — ED Provider Notes (Signed)
History     CSN: [id number]  Arrival date & time 10/14/12  1239   First MD Initiated Contact with Patient 10/14/12 1338      Chief Complaint  Patient presents with  . Fall    (Consider location/radiation/quality/duration/timing/severity/associated sxs/prior treatment) Patient is a 79 y.o. female presenting with fall. The history is provided by the patient.  Fall Pertinent negatives include no fever, no numbness, no abdominal pain, no nausea, no vomiting and no headaches.  s/p [facility] at [facility]. Pt states got up quickly as had urge to urinate and was afraid she wouldn't make it to bathroom in time.  Did not use her walker which she would normally use to walk. Felt lightheaded upon standing, and on way into bathroom fell. States she isnt sure what made her fall. Denies loc. C/o cnotusion/pain/abrasion to left knee. Bumped chin. No headache. No neck or back pain. Denies any other pain or injury. No numbness/weakness. No nv. Denies any current or recent cp or discomfort. No sob. No abd pain. No nvd. No gu c/o. Denies recent change in meds, no fever or chills. No anticoag use.   Past Medical History  Diagnosis Date  . Dementia   . High cholesterol   . Glaucoma   . TIA (transient ischemic attack)     Past Surgical History  Procedure Laterality Date  . Abdominal hysterectomy    . Cholecystectomy      History reviewed. No pertinent family history.  History  Substance Use Topics  . Smoking status: Never Smoker   . Smokeless tobacco: Not on file  . Alcohol Use: No    OB History   Grav Para Term Preterm Abortions TAB SAB Ect Mult Living                  Review of Systems  Constitutional: Negative for fever and chills.  HENT: Negative for neck pain.   Eyes: Negative for redness.  Respiratory: Negative for cough and shortness of breath.   Cardiovascular: Negative for chest pain, palpitations and leg swelling.  Gastrointestinal: Negative for nausea, vomiting, abdominal pain and  blood in stool.  Genitourinary: Negative for dysuria and flank pain.  Musculoskeletal: Negative for back pain.  Skin: Negative for rash.  Neurological: Negative for weakness, numbness and headaches.  Hematological: Does not bruise/bleed easily.  Psychiatric/Behavioral: Negative for confusion.    Allergies  Review of patient's allergies indicates no known allergies.  Home Medications   Current Outpatient Rx  Name  Route  Sig  Dispense  Refill  . acetaminophen (TYLENOL) 325 MG tablet   Oral   Take 650 mg by mouth every 4 (four) hours as needed. pain         . bisacodyl (BISACODYL) 5 MG EC tablet   Oral   Take 5 mg by mouth at bedtime.         . donepezil (ARICEPT) 10 MG tablet   Oral   Take 10 mg by mouth at bedtime.         . ENSURE (ENSURE)   Oral   Take 237 mLs by mouth 3 (three) times daily between meals.         Marland Kitchen ibuprofen (ADVIL,MOTRIN) 400 MG tablet   Oral   Take 400 mg by mouth every 8 (eight) hours as needed. pain         . LORazepam (ATIVAN) 0.5 MG tablet   Oral   Take 0.5 mg by mouth every 8 (eight) hours. Anxiety         .  mirtazapine (REMERON) 15 MG tablet   Oral   Take 15 mg by mouth See admin instructions. 15 mg by mouth at bedtime starting 03/25/12         . QUEtiapine (SEROQUEL) 50 MG tablet   Oral   Take 50 mg by mouth See admin instructions. Take 1 tablet by mouth at bedtime for 7 days starting 03/18/12 then stop seroquel and start remeron 15mg  1 tablet at bedtime         . venlafaxine (EFFEXOR) 37.5 MG tablet   Oral   Take 75 mg by mouth at bedtime.           BP 147/75  Temp(Src) 98 F (36.7 C) (Oral)  Resp 16  SpO2 96%  Physical Exam  Nursing note and vitals reviewed. Constitutional: She is oriented to person, place, and time. She appears well-developed and well-nourished. No distress.  HENT:  Mouth/Throat: Oropharynx is clear and moist.  Minimal contusion to chin. Teeth firmly intact. No malocclusion.   Eyes:  Conjunctivae are normal. Pupils are equal, round, and reactive to light. No scleral icterus.  Neck: Normal range of motion. Neck supple. No tracheal deviation present.  No bruit  Cardiovascular: Normal rate, regular rhythm, normal heart sounds and intact distal pulses.   Pulmonary/Chest: Effort normal and breath sounds normal. No respiratory distress.  Abdominal: Soft. Normal appearance and bowel sounds are normal. She exhibits no distension and no mass. There is no tenderness. There is no rebound and no guarding.  No  puls mass.   Genitourinary:  No cva tenderness  Musculoskeletal: She exhibits no edema and no tenderness.  Contusion left knee. Knee stable. No gross ligament laxity.  No effusion. Abrasion to knee. Distal pulses palp. Good rom bil ext, no other focal pain or bony tenderness.  CTLS spine, non tender, aligned, no step off.   Neurological: She is alert and oriented to person, place, and time.  Motor intact bil.   Skin: Skin is warm and dry. No rash noted.  Psychiatric: She has a normal mood and affect.    ED Course  Procedures (including critical care time)   Results for orders placed during the hospital encounter of 10/14/12  BASIC METABOLIC PANEL      Result Value Range   Sodium 141  135 - 145 mEq/L   Potassium 3.7  3.5 - 5.1 mEq/L   Chloride 104  96 - 112 mEq/L   CO2 30  19 - 32 mEq/L   Glucose, Bld 92  70 - 99 mg/dL   BUN 14  6 - 23 mg/dL   Creatinine, Ser 1.47  0.50 - 1.10 mg/dL   Calcium 9.5  8.4 - 82.9 mg/dL   GFR calc non Af Amer 78 (*) >90 mL/min   GFR calc Af Amer >90  >90 mL/min  CBC      Result Value Range   WBC 7.6  4.0 - 10.5 K/uL   RBC 4.93  3.87 - 5.11 MIL/uL   Hemoglobin 14.2  12.0 - 15.0 g/dL   HCT 56.2  13.0 - 86.5 %   MCV 81.7  78.0 - 100.0 fL   MCH 28.8  26.0 - 34.0 pg   MCHC 35.2  30.0 - 36.0 g/dL   RDW 78.4  69.6 - 29.5 %   Platelets 258  150 - 400 K/uL  URINALYSIS, ROUTINE W REFLEX MICROSCOPIC      Result Value Range   Color, Urine  YELLOW  YELLOW   APPearance  CLEAR  CLEAR   Specific Gravity, Urine 1.012  1.005 - 1.030   pH 6.0  5.0 - 8.0   Glucose, UA NEGATIVE  NEGATIVE mg/dL   Hgb urine dipstick NEGATIVE  NEGATIVE   Bilirubin Urine NEGATIVE  NEGATIVE   Ketones, ur 15 (*) NEGATIVE mg/dL   Protein, ur NEGATIVE  NEGATIVE mg/dL   Urobilinogen, UA 0.2  0.0 - 1.0 mg/dL   Nitrite NEGATIVE  NEGATIVE   Leukocytes, UA NEGATIVE  NEGATIVE   Dg Knee Complete 4 Views Left  10/14/2012  *RADIOLOGY REPORT*  Clinical Data: Left knee pain, limited range of motion  LEFT KNEE - COMPLETE 4+ VIEW  Comparison: None.  Findings:  No fracture or dislocation.  Moderate to severe tricompartmental degenerative change, worst in the medial compartment with joint space loss, articular surface irregularity, subchondral sclerosis and osteophytosis.  Suspected small suprapatellar joint effusion. Multiple loose bodies are seen within the posterior aspect of the knee joint space.  No definite evidence of chondrocalcinosis. Minimal vascular calcifications. Regional soft tissues otherwise normal.  IMPRESSION:  1.  No acute findings. 2.  Moderate to severe tricompartmental degenerative change of the knee, worse within the medial compartment.  3. Multiple loose bodies are seen within the posterior aspect of the knee joint space.   Original Report Authenticated By: Tacey Ruiz, MD       MDM  Iv ns. Monitor. Labs.   Date: 10/14/2012  Rate: 75  Rhythm: normal sinus rhythm  QRS Axis: normal  Intervals: normal  ST/T Wave abnormalities: normal  Conduction Disutrbances:none  Narrative Interpretation:   Old EKG Reviewed: none available  Recheck pt content, alert. Denies pain.   Po fluids, tolerates well. No faintness or dizziness.  Recheck spine nt.  Pt appears stable for d/c.         Suzi Roots, MD 10/14/12 1750

## 2012-10-14 NOTE — ED Notes (Signed)
Pt waiting for transport

## 2013-03-10 DIAGNOSIS — R001 Bradycardia, unspecified: Secondary | ICD-10-CM

## 2013-03-10 HISTORY — DX: Bradycardia, unspecified: R00.1

## 2013-03-28 ENCOUNTER — Emergency Department (HOSPITAL_COMMUNITY): Payer: Medicare Other

## 2013-03-28 ENCOUNTER — Encounter (HOSPITAL_COMMUNITY): Payer: Self-pay | Admitting: Cardiology

## 2013-03-28 ENCOUNTER — Inpatient Hospital Stay (HOSPITAL_COMMUNITY)
Admission: EM | Admit: 2013-03-28 | Discharge: 2013-04-06 | DRG: 243 | Disposition: A | Discharge status: Home Health | Payer: Medicare Other | Attending: Internal Medicine | Admitting: Internal Medicine

## 2013-03-28 DIAGNOSIS — Z8673 Personal history of transient ischemic attack (TIA), and cerebral infarction without residual deficits: Secondary | ICD-10-CM

## 2013-03-28 DIAGNOSIS — S270XXA Traumatic pneumothorax, initial encounter: Secondary | ICD-10-CM

## 2013-03-28 DIAGNOSIS — R0902 Hypoxemia: Secondary | ICD-10-CM | POA: Diagnosis not present

## 2013-03-28 DIAGNOSIS — I498 Other specified cardiac arrhythmias: Secondary | ICD-10-CM

## 2013-03-28 DIAGNOSIS — Z7982 Long term (current) use of aspirin: Secondary | ICD-10-CM

## 2013-03-28 DIAGNOSIS — Z23 Encounter for immunization: Secondary | ICD-10-CM

## 2013-03-28 DIAGNOSIS — J95811 Postprocedural pneumothorax: Secondary | ICD-10-CM

## 2013-03-28 DIAGNOSIS — S270XXS Traumatic pneumothorax, sequela: Secondary | ICD-10-CM

## 2013-03-28 DIAGNOSIS — I10 Essential (primary) hypertension: Secondary | ICD-10-CM | POA: Diagnosis present

## 2013-03-28 DIAGNOSIS — E782 Mixed hyperlipidemia: Secondary | ICD-10-CM | POA: Diagnosis present

## 2013-03-28 DIAGNOSIS — F03918 Unspecified dementia, unspecified severity, with other behavioral disturbance: Secondary | ICD-10-CM

## 2013-03-28 DIAGNOSIS — R001 Bradycardia, unspecified: Secondary | ICD-10-CM

## 2013-03-28 DIAGNOSIS — F0391 Unspecified dementia with behavioral disturbance: Secondary | ICD-10-CM

## 2013-03-28 DIAGNOSIS — Z79899 Other long term (current) drug therapy: Secondary | ICD-10-CM

## 2013-03-28 DIAGNOSIS — H409 Unspecified glaucoma: Secondary | ICD-10-CM | POA: Diagnosis present

## 2013-03-28 DIAGNOSIS — I495 Sick sinus syndrome: Principal | ICD-10-CM

## 2013-03-28 DIAGNOSIS — R55 Syncope and collapse: Secondary | ICD-10-CM

## 2013-03-28 HISTORY — DX: Bradycardia, unspecified: R00.1

## 2013-03-28 HISTORY — DX: Mixed hyperlipidemia: E78.2

## 2013-03-28 LAB — CBC WITH DIFFERENTIAL/PLATELET
Basophils Absolute: 0.1 10*3/uL (ref 0.0–0.1)
Basophils Relative: 1 % (ref 0–1)
Eosinophils Absolute: 0.1 10*3/uL (ref 0.0–0.7)
Eosinophils Relative: 1 % (ref 0–5)
HCT: 41.1 % (ref 36.0–46.0)
Hemoglobin: 13.9 g/dL (ref 12.0–15.0)
Lymphocytes Relative: 14 % (ref 12–46)
Lymphs Abs: 1.2 10*3/uL (ref 0.7–4.0)
MCH: 29 pg (ref 26.0–34.0)
MCHC: 33.8 g/dL (ref 30.0–36.0)
MCV: 85.6 fL (ref 78.0–100.0)
Monocytes Absolute: 0.4 10*3/uL (ref 0.1–1.0)
Monocytes Relative: 5 % (ref 3–12)
Neutro Abs: 6.9 10*3/uL (ref 1.7–7.7)
Neutrophils Relative %: 79 % — ABNORMAL HIGH (ref 43–77)
Platelets: 259 10*3/uL (ref 150–400)
RBC: 4.8 MIL/uL (ref 3.87–5.11)
RDW: 13.6 % (ref 11.5–15.5)
WBC: 8.7 10*3/uL (ref 4.0–10.5)

## 2013-03-28 LAB — BASIC METABOLIC PANEL
BUN: 20 mg/dL (ref 6–23)
CO2: 30 mEq/L (ref 19–32)
Calcium: 9.6 mg/dL (ref 8.4–10.5)
Chloride: 101 mEq/L (ref 96–112)
Creatinine, Ser: 0.68 mg/dL (ref 0.50–1.10)
GFR calc Af Amer: 87 mL/min — ABNORMAL LOW (ref >90)
GFR calc non Af Amer: 75 mL/min — ABNORMAL LOW (ref >90)
Glucose, Bld: 135 mg/dL — ABNORMAL HIGH (ref 70–99)
Potassium: 4.2 mEq/L (ref 3.5–5.1)
Sodium: 138 mEq/L (ref 135–145)

## 2013-03-28 LAB — TROPONIN I: Troponin I: <0.3 ng/mL (ref <0.30)

## 2013-03-28 LAB — MRSA PCR SCREENING: MRSA by PCR: POSITIVE — AB

## 2013-03-28 LAB — TSH: TSH: 0.597 u[IU]/mL (ref 0.350–4.500)

## 2013-03-28 LAB — MAGNESIUM: Magnesium: 2.4 mg/dL (ref 1.5–2.5)

## 2013-03-28 MED ORDER — SODIUM CHLORIDE 0.9 % IV SOLN
INTRAVENOUS | Status: DC
  Administered 2013-03-28: 11:00:00 via INTRAVENOUS

## 2013-03-28 MED ORDER — SODIUM CHLORIDE 0.9 % IV SOLN
INTRAVENOUS | Status: DC
  Administered 2013-03-29: 01:00:00 via INTRAVENOUS

## 2013-03-28 MED ORDER — ASPIRIN EC 81 MG PO TBEC
81.0000 mg | DELAYED_RELEASE_TABLET | Freq: Every day | ORAL | Status: DC
  Filled 2013-03-28 (×2): qty 1

## 2013-03-28 MED ORDER — ATROPINE SULFATE 0.1 MG/ML IJ SOLN
INTRAMUSCULAR | Status: AC
  Filled 2013-03-28: qty 10

## 2013-03-28 MED ORDER — BISACODYL 5 MG PO TBEC
5.0000 mg | DELAYED_RELEASE_TABLET | Freq: Every day | ORAL | Status: DC
  Administered 2013-03-28 – 2013-04-05 (×8): 5 mg via ORAL
  Filled 2013-03-28 (×12): qty 1

## 2013-03-28 MED ORDER — ACETAMINOPHEN 325 MG PO TABS: 650.0000 mg | ORAL_TABLET | ORAL | Status: DC | PRN

## 2013-03-28 MED ORDER — ONDANSETRON HCL 4 MG/2ML IJ SOLN
INTRAMUSCULAR | Status: AC
  Filled 2013-03-28: qty 2

## 2013-03-28 MED ORDER — VENLAFAXINE HCL ER 150 MG PO CP24
150.0000 mg | ORAL_CAPSULE | Freq: Every day | ORAL | Status: DC
  Administered 2013-03-30 – 2013-04-06 (×7): 150 mg via ORAL
  Filled 2013-03-28 (×11): qty 1

## 2013-03-28 MED ORDER — LORAZEPAM 0.5 MG PO TABS: 0.5000 mg | ORAL_TABLET | Freq: Four times a day (QID) | ORAL | Status: DC | PRN

## 2013-03-28 MED ORDER — SODIUM CHLORIDE 0.9 % IJ SOLN
3.0000 mL | Freq: Two times a day (BID) | INTRAMUSCULAR | Status: DC
  Administered 2013-03-29: 3 mL via INTRAVENOUS

## 2013-03-28 MED ORDER — ONDANSETRON HCL 4 MG/2ML IJ SOLN
4.0000 mg | Freq: Once | INTRAMUSCULAR | Status: AC
  Administered 2013-03-28: 4 mg via INTRAVENOUS

## 2013-03-28 MED ORDER — SIMVASTATIN 20 MG PO TABS
20.0000 mg | ORAL_TABLET | Freq: Every day | ORAL | Status: DC
  Administered 2013-03-28 – 2013-04-05 (×9): 20 mg via ORAL
  Filled 2013-03-28 (×12): qty 1

## 2013-03-28 MED ORDER — MIRTAZAPINE 15 MG PO TABS
15.0000 mg | ORAL_TABLET | Freq: Every day | ORAL | Status: DC
  Administered 2013-03-28 – 2013-04-05 (×9): 15 mg via ORAL
  Filled 2013-03-28 (×11): qty 1

## 2013-03-28 NOTE — ED Provider Notes (Signed)
CSN: [id number]     Arrival date & time 03/28/13  1015 History   First MD Initiated Contact with Patient 03/28/13 1033     Chief Complaint  Patient presents with  . Emesis   (Consider location/radiation/quality/duration/timing/severity/associated sxs/prior Treatment) HPI  79 year old female sent from [facility] for evaluation after seeing Flovent just prior to arrival. Patient apparently went to use the bathroom, subsequently vomited and then had a brief loss of consciousness.Quick return to her baseline. Patient has a past history of dementia. She is unable to provide much useful history and most from report and records. She does currently denies any complaints aside from feeling generally weak. She denies any chest pain or pain anywhere else. No shortness of breath. Does not appear to have significant cardiac history.  Past Medical History  Diagnosis Date  . Dementia   . High cholesterol   . Glaucoma   . TIA (transient ischemic attack)    Past Surgical History  Procedure Laterality Date  . Abdominal hysterectomy    . Cholecystectomy     No family history on file. History  Substance Use Topics  . Smoking status: Never Smoker   . Smokeless tobacco: Not on file  . Alcohol Use: No   OB History   Grav Para Term Preterm Abortions TAB SAB Ect Mult Living                 Review of Systems  Level 5 caveat because of dementia.   Allergies  Review of patient's allergies indicates no known allergies.  Home Medications   Current Outpatient Rx  Name  Route  Sig  Dispense  Refill  . acetaminophen (TYLENOL) 325 MG tablet   Oral   Take 650 mg by mouth every 4 (four) hours as needed. pain         . aspirin EC 81 MG tablet   Oral   Take 81 mg by mouth daily.         . bisacodyl (BISACODYL) 5 MG EC tablet   Oral   Take 5 mg by mouth at bedtime.         . donepezil (ARICEPT) 10 MG tablet   Oral   Take 10 mg by mouth daily.          Marland Kitchen LORazepam (ATIVAN) 0.5 MG  tablet   Oral   Take 0.5-0.75 mg by mouth every 6 (six) hours as needed for anxiety (and prior to showers). Anxiety         . mirtazapine (REMERON) 15 MG tablet   Oral   Take 15 mg by mouth at bedtime.         . simvastatin (ZOCOR) 20 MG tablet   Oral   Take 20 mg by mouth at bedtime.         . Skin Protectants, Misc. (EUCERIN) cream   Topical   Apply 1 application topically daily.         Marland Kitchen venlafaxine XR (EFFEXOR-XR) 150 MG 24 hr capsule   Oral   Take 150 mg by mouth daily.          BP 152/65  Pulse 76  Temp(Src) 97.5 F (36.4 C) (Oral)  Resp 14  SpO2 95% Physical Exam  Nursing note and vitals reviewed. Constitutional: She appears well-developed.  Laying in bed. Appears tired, but not distressed. Vomit on night gown.   HENT:  Head: Normocephalic and atraumatic.  Eyes: Conjunctivae are normal. Right eye exhibits no discharge. Left eye  exhibits no discharge.  Neck: Neck supple.  Cardiovascular: Normal rate, regular rhythm and normal heart sounds.  Exam reveals no gallop and no friction rub.   No murmur heard. Pulmonary/Chest: Effort normal and breath sounds normal. No respiratory distress.  Abdominal: Soft. She exhibits no distension. There is no tenderness.  Musculoskeletal: She exhibits no edema and no tenderness.  Neurological: No cranial nerve deficit. She exhibits normal muscle tone. Coordination normal.  Lays with eyes closed but opens to voice. answering questions appropriately. Follows simple commands. No focal motor deficit noted.   Skin: Skin is warm and dry.    ED Course  Procedures (including critical care time)  CRITICAL CARE Performed by: Raeford Razor  Total critical care time: 35 minutes  Critical care time was exclusive of separately billable procedures and treating other patients. Critical care was necessary to treat or prevent imminent or life-threatening deterioration. Critical care was time spent personally by me on the following  activities: development of treatment plan with patient and/or surrogate as well as nursing, discussions with consultants, evaluation of patient's response to treatment, examination of patient, obtaining history from patient or surrogate, ordering and performing treatments and interventions, ordering and review of laboratory studies, ordering and review of radiographic studies, pulse oximetry and re-evaluation of patient's condition.  Labs Review Labs Reviewed  CBC WITH DIFFERENTIAL - Abnormal; Notable for the following:    Neutrophils Relative % 79 (*)    All other components within normal limits  BASIC METABOLIC PANEL - Abnormal; Notable for the following:    Glucose, Bld 135 (*)    GFR calc non Af Amer 75 (*)    GFR calc Af Amer 87 (*)    All other components within normal limits  MAGNESIUM  TROPONIN I  TSH   Imaging Review Dg Chest Portable 1 View  03/28/2013   CLINICAL DATA:  Status post code blue:  Emesis, bradycardia  EXAM: PORTABLE CHEST - 1 VIEW  COMPARISON:  Prior chest x-ray 01/09/2012; prior CT chest 03/31/2012  FINDINGS: External defibrillator pads project over the left chest limiting evaluation of the left base. The cardiac and mediastinal contours are stable and within normal limits. No focal airspace consolidation. Chronic bronchitic changes and prominence of the interstitial markings are similar compared to prior. Atherosclerotic calcifications are noted in the transverse aorta. No pneumothorax. Low inspiratory volumes with mild right basilar atelectasis and probable left basilar atelectasis. No acute osseous abnormality.  IMPRESSION: No definite acute active disease. Limited evaluation of the left base secondary to obscuring external defibrillator pads.  Slightly low inspiratory volumes with mild right basilar atelectasis.   Electronically Signed   By: Malachy Moan M.D.   On: 03/28/2013 11:38    EKG Interpretation     Ventricular Rate:  75 PR Interval:  151 QRS  Duration: 90 QT Interval:  460 QTC Calculation: 514 R Axis:   31 Text Interpretation:  Sinus rhythm LAE, consider biatrial enlargement Prolonged QT interval            MDM   1. Syncope     79 year old female transferred for evaluation of syncopal event earlier today. Patient had another event while in the emergency room. She became bradycardic and had a pause of approximately 10 seconds and was unresponsice. She resumed a normal sinus rhythm without intervention. Currently no complaints aside from feeling tired. Initial EKG and repeat EKG after this episode shows sinus rhythm. QT interval is mildly prolonged at 510 ms. She has new T-wave inversions  in V2 as compared to previous. Otherwise EKG is pretty unremarkable. Pt placed on pads with crash cart at bedside. Per review of records, she does not appear to have a significant cardiac history.   12:07 PM Another syncopal event. Brady, pause, resumption of NSR w/o intervention. Remains w/o complaints aside from fatigue. Cardiology paged.   12:19 PM Discussed with Pt's daughter, [name]. It does not sound like pt has a specified medical POA. Discussed that will likely need PM and decision maker needs to be designated.   12:22 PM Discussed with Dr Diona Browner, cardiology. Transfer to [facility].   Raeford Razor, MD 03/28/13 1407

## 2013-03-28 NOTE — ED Notes (Signed)
At about 1050 during assessment, noted asytole on monitor. Pt became unresponsive with eyes rolling back in head. Pt unable to respond to verbal stimulation. Sternum rub performed and this rn called for help. Pt gradually responsive to sternum rub. Redness noted in face with swallowing motions. Pt able to open eyes. mD at bedside and ordered EKG. Done. See results.

## 2013-03-28 NOTE — Consult Note (Signed)
Cardiology Consult Note   Patient ID: Amy Wilkinson MRN: [id number], DOB/AGE: 1922-05-15   Admit date: 03/28/2013 Date of Consult: 03/28/2013  Primary Physician: Allean Found, MD Consulting Cardiologist: Dr. Jonelle Sidle  Reason for consult: Syncope, bradycardia, pauses  HPI: Amy Wilkinson is a 79 y.o. female w/ no explicit cardiac history, w/ PMHx s/f dementia (on Aricept), HLD and h/o TIA who was transferred from [facility] ED to [facility] today for syncope.   The history is limited by the patient's underlying dementia. Gathering from ED provider notes, she resides at [facility]. She has advanced dementia and received specialized care for this at her facility. She had an episode of emesis today and then syncopized briefly. She was transported to [facility] ED.   In the ED, EKG revealed NSR, subtle PR depressions, TWIs V1, V2, no ST changes or arrhythmias. Initial TnI WNL. BMET and CBC unremarkable. Portable CXR indicates aortic atherosclerosis, low inspiratory volume, mild R basilar atelectasis and probable L basilar atelectasis, no acute abnormality. She had another episode of syncope in the [facility] ED with associated bradycardia and 10 s pause per report. She was transferred to [facility] CCU. Telemetry review indicates two episodes of sinus bradycardia to 40 bpm, then sinus pauses > 10 s.  She currently denies chest pain now or previously. Denies shortness of breath. No further nausea/vomiting. No family member present to gather additional components of the HPI.   VSS. HR 70s, BP 170/68.  Problem List: Past Medical History  Diagnosis Date  . Dementia   . Mixed hyperlipidemia   . Glaucoma   . TIA (transient ischemic attack)     Past Surgical History  Procedure Laterality Date  . Abdominal hysterectomy    . Cholecystectomy       Allergies: No Known Allergies  Home Medications: Prior to Admission medications   Medication Sig Start Date End Date Taking? Authorizing  Provider  acetaminophen (TYLENOL) 325 MG tablet Take 650 mg by mouth every 4 (four) hours as needed. pain   Yes Historical Provider, MD  aspirin EC 81 MG tablet Take 81 mg by mouth daily.   Yes Historical Provider, MD  bisacodyl (BISACODYL) 5 MG EC tablet Take 5 mg by mouth at bedtime.   Yes Historical Provider, MD  donepezil (ARICEPT) 10 MG tablet Take 10 mg by mouth daily.    Yes Historical Provider, MD  LORazepam (ATIVAN) 0.5 MG tablet Take 0.5-0.75 mg by mouth every 6 (six) hours as needed for anxiety (and prior to showers). Anxiety   Yes Historical Provider, MD  mirtazapine (REMERON) 15 MG tablet Take 15 mg by mouth at bedtime.   Yes Historical Provider, MD  simvastatin (ZOCOR) 20 MG tablet Take 20 mg by mouth at bedtime.   Yes Historical Provider, MD  Skin Protectants, Misc. (EUCERIN) cream Apply 1 application topically daily.   Yes Historical Provider, MD  venlafaxine XR (EFFEXOR-XR) 150 MG 24 hr capsule Take 150 mg by mouth daily.   Yes Historical Provider, MD    Inpatient Medications:    Prescriptions prior to admission  Medication Sig Dispense Refill  . acetaminophen (TYLENOL) 325 MG tablet Take 650 mg by mouth every 4 (four) hours as needed. pain      . aspirin EC 81 MG tablet Take 81 mg by mouth daily.      . bisacodyl (BISACODYL) 5 MG EC tablet Take 5 mg by mouth at bedtime.      . donepezil (ARICEPT) 10 MG  tablet Take 10 mg by mouth daily.       Marland Kitchen LORazepam (ATIVAN) 0.5 MG tablet Take 0.5-0.75 mg by mouth every 6 (six) hours as needed for anxiety (and prior to showers). Anxiety      . mirtazapine (REMERON) 15 MG tablet Take 15 mg by mouth at bedtime.      . simvastatin (ZOCOR) 20 MG tablet Take 20 mg by mouth at bedtime.      . Skin Protectants, Misc. (EUCERIN) cream Apply 1 application topically daily.      Marland Kitchen venlafaxine XR (EFFEXOR-XR) 150 MG 24 hr capsule Take 150 mg by mouth daily.        No family history on file.   History   Social History  . Marital Status:  Widowed    Spouse Name: N/A    Number of Children: N/A  . Years of Education: N/A   Occupational History  . Not on file.   Social History Main Topics  . Smoking status: Never Smoker   . Smokeless tobacco: Not on file  . Alcohol Use: No  . Drug Use: No  . Sexual Activity: No   Other Topics Concern  . Not on file   Social History Narrative  . No narrative on file     Review of Systems:  Unable to obtain due to dementia.   Physical Exam: Blood pressure 140/67, pulse 69, temperature 98 F (36.7 C), temperature source Oral, resp. rate 14, SpO2 100.00%.    General: Elderly, well developed, well nourished, in no acute distress. Head: Normocephalic, atraumatic, sclera non-icteric, no xanthomas, nares are without discharge.  Neck: Negative for carotid bruits. JVD not elevated. Lungs: Clear bilaterally to auscultation without wheezes, rales, or rhonchi. Breathing is unlabored. Heart: RRR with S1 S2. No murmurs, rubs, or gallops appreciated. Abdomen: Soft, non-tender, non-distended with normoactive bowel sounds. No hepatomegaly. No rebound/guarding. No obvious abdominal masses. Msk:  Strength and tone appears normal for age. Extremities: Trace bilateral pretibial edema. No clubbing or cyanosis.  Distal pedal pulses are 2+ and equal bilaterally. Neuro: Alert and oriented X 3. Moves all extremities spontaneously. Psych:  Responds to questions appropriately with a normal affect.  Labs: Recent Labs     03/28/13  1122  WBC  8.7  HGB  13.9  HCT  41.1  MCV  85.6  PLT  259   Recent Labs Lab 03/28/13 1122  NA 138  K 4.2  CL 101  CO2 30  BUN 20  CREATININE 0.68  CALCIUM 9.6  GLUCOSE 135*   Recent Labs     03/28/13  1122  TROPONINI  <0.30   Radiology/Studies: Dg Chest Portable 1 View  03/28/2013   CLINICAL DATA:  Status post code blue:  Emesis, bradycardia  EXAM: PORTABLE CHEST - 1 VIEW  COMPARISON:  Prior chest x-ray 01/09/2012; prior CT chest 03/31/2012  FINDINGS:  External defibrillator pads project over the left chest limiting evaluation of the left base. The cardiac and mediastinal contours are stable and within normal limits. No focal airspace consolidation. Chronic bronchitic changes and prominence of the interstitial markings are similar compared to prior. Atherosclerotic calcifications are noted in the transverse aorta. No pneumothorax. Low inspiratory volumes with mild right basilar atelectasis and probable left basilar atelectasis. No acute osseous abnormality.  IMPRESSION: No definite acute active disease. Limited evaluation of the left base secondary to obscuring external defibrillator pads.  Slightly low inspiratory volumes with mild right basilar atelectasis.   Electronically Signed   By:  Malachy Moan M.D.   On: 03/28/2013 11:38   EKG: NSR, 75 bpm, LAE, TWIs V1, V2, subtle PR depressions, no ST changes or arrhythmias  ASSESSMENT AND PLAN:   79 y.o. female w/ no explicit cardiac history, w/ PMHx s/f dementia (on Aricept), HLD and h/o TIA who was transferred from [facility] ED to [facility] today for syncope.   1. Syncope 2. Sinus node dysfunction 3. Advanced dementia 4. Hyperlipidemia 5. History of TIA  HPI is quite limited due to dementia. No family available to supplement history. She was admitted after a syncopal episode at her assisted living facility. The patient has had several instances of sinus bradycardia with prolonged pauses > 10s documented on telemetry.This was correlated to another syncopal episode at [facility] ED per ED notes. HR 70s currently, normotensive. No chest pain, palpitations, shortness of breath. EKG indicates subtle PR depressions/concave ST elevations. Hold Aricept, AVN blockers. Check 2D echo. Keep Zoll pads on and atropine at bedside. Will make NPO tomorrow, however doubt device would be placed that soon. Will need to establish POA for consent. EP to weigh in tomorrow.   Signed, R. Hurman Horn, PA-C 03/28/2013, 4:00  PM   Attending note:  Patient seen and examined. Discussed with Mr. Francee Gentile. Ms. Halley presents from nursing center, in memory care unit secondary to dementia (on Aricept), after episode of N/V and syncope. Noted to have prolonged pause on monitor in ED in setting of recurrent syncope consistent with sinus pause/SSS. She has not required specific treatment as yet, admitted to CCU for observation and EP consultation regarding possible pacemaker. She is hemodynamically stable at present, does not provide history. Troponin I negative. ECG shows sinus rhythm with NSST changes and prolonged QT, normal PR and QRS.  For now would stop Aricept as this can sometimes exacerbate conduction abnormalities. Temporary pacer pads in place with atropine at bedside. Would not place temporary wire at this time, risk likely exceeds benefit at this point. ED did speak with daughter - there is no health care POA I am told. This will need to be clarified for further discussion with EP. If pacemaker is considered, patient will not be able to sign consent.  Jonelle Sidle, M.D., F.A.C.C.

## 2013-03-28 NOTE — H&P (Signed)
[facility] History and Physical  Amy Wilkinson EAV:[id number] DOB: 04-Oct-1921 DOA: 03/28/2013  Referring physician: Dr. Juleen China  PCP: Allean Found, MD  Specialists: cardiology consulted in the ED  Chief Complaint: "leave me alone"  HPI: Amy Wilkinson is a 79 y.o. female  Past medical history of dementia, TIA, and hyperlipidemia. Much of the history is obtained from EMR given the patient has dementia and limited cooperation with examiner.Per ED physicians note patient was brought to the hospital secondary to syncopal episode.As such we were initially consult if further evaluation or recommendations. But as time passed patient was noted to have bradycardia with pauses approximately 10 seconds associated with unresponsiveness.  In ED we were consulted for further admission and evaluation and Cardiology was consulted for further recommendations given Bradycardia associated with syncopal episodes. Troponin negative while in the ED.   Review of Systems: unable to properly assess due to dementia  Past Medical History  Diagnosis Date  . Dementia   . Mixed hyperlipidemia   . Glaucoma   . TIA (transient ischemic attack)    Past Surgical History  Procedure Laterality Date  . Abdominal hysterectomy    . Cholecystectomy     Social History:  reports that she has never smoked. She does not have any smokeless tobacco history on file. She reports that she does not drink alcohol or use illicit drugs.  where does patient live--home, ALF, SNF? and with whom if at home?patient is from a skilled nursing facility  Can patient participate in ADLs?not currently  No Known Allergies  History reviewed. No pertinent family history. unable to obtain to limited cooperation with examiner  Prior to Admission medications   Medication Sig Start Date End Date Taking? Authorizing Provider  acetaminophen (TYLENOL) 325 MG tablet Take 650 mg by mouth every 4 (four) hours as needed. pain   Yes  Historical Provider, MD  aspirin EC 81 MG tablet Take 81 mg by mouth daily.   Yes Historical Provider, MD  bisacodyl (BISACODYL) 5 MG EC tablet Take 5 mg by mouth at bedtime.   Yes Historical Provider, MD  donepezil (ARICEPT) 10 MG tablet Take 10 mg by mouth daily.    Yes Historical Provider, MD  LORazepam (ATIVAN) 0.5 MG tablet Take 0.5-0.75 mg by mouth every 6 (six) hours as needed for anxiety (and prior to showers). Anxiety   Yes Historical Provider, MD  mirtazapine (REMERON) 15 MG tablet Take 15 mg by mouth at bedtime.   Yes Historical Provider, MD  simvastatin (ZOCOR) 20 MG tablet Take 20 mg by mouth at bedtime.   Yes Historical Provider, MD  Skin Protectants, Misc. (EUCERIN) cream Apply 1 application topically daily.   Yes Historical Provider, MD  venlafaxine XR (EFFEXOR-XR) 150 MG 24 hr capsule Take 150 mg by mouth daily.   Yes Historical Provider, MD   Physical Exam: Filed Vitals:   03/28/13 1600  BP: 170/68  Pulse: 81  Temp:   Resp: 17     General:  Patient laying in bed resting comfortably, awakens on response  Eyes: nonicteric  ENT: normal exterior appearance, dry mucous membranes  Neck: supple,  no goiter  Cardiovascular: regular rate and rhythm, no rubs  Respiratory: clear to auscultation bilaterally, no rales  Abdomen:  nontender, nondistended unable to assess well due to limited patient cooperation  Skin: warm and dry  Musculoskeletal: no cyanosis  Psychiatric: unable to assess well due to limited patient cooperation  Neurologic: unable to assess well due to limited patient  cooperation, responds appropriately to question  Labs on Admission:  Basic Metabolic Panel:  Recent Labs Lab 03/28/13 1122  NA 138  K 4.2  CL 101  CO2 30  GLUCOSE 135*  BUN 20  CREATININE 0.68  CALCIUM 9.6  MG 2.4   Liver Function Tests: No results found for this basename: AST, ALT, ALKPHOS, BILITOT, PROT, ALBUMIN,  in the last 168 hours No results found for this basename:  LIPASE, AMYLASE,  in the last 168 hours No results found for this basename: AMMONIA,  in the last 168 hours CBC:  Recent Labs Lab 03/28/13 1122  WBC 8.7  NEUTROABS 6.9  HGB 13.9  HCT 41.1  MCV 85.6  PLT 259   Cardiac Enzymes:  Recent Labs Lab 03/28/13 1122  TROPONINI <0.30    BNP (last 3 results) No results found for this basename: PROBNP,  in the last 8760 hours CBG: No results found for this basename: GLUCAP,  in the last 168 hours  Radiological Exams on Admission: Dg Chest Portable 1 View  03/28/2013   CLINICAL DATA:  Status post code blue:  Emesis, bradycardia  EXAM: PORTABLE CHEST - 1 VIEW  COMPARISON:  Prior chest x-ray 01/09/2012; prior CT chest 03/31/2012  FINDINGS: External defibrillator pads project over the left chest limiting evaluation of the left base. The cardiac and mediastinal contours are stable and within normal limits. No focal airspace consolidation. Chronic bronchitic changes and prominence of the interstitial markings are similar compared to prior. Atherosclerotic calcifications are noted in the transverse aorta. No pneumothorax. Low inspiratory volumes with mild right basilar atelectasis and probable left basilar atelectasis. No acute osseous abnormality.  IMPRESSION: No definite acute active disease. Limited evaluation of the left base secondary to obscuring external defibrillator pads.  Slightly low inspiratory volumes with mild right basilar atelectasis.   Electronically Signed   By: Malachy Moan M.D.   On: 03/28/2013 11:38    EKG: Independently reviewed. Normal Sinus rhythm, no ST elevations or depressions T-wave inversion in V2  Assessment/Plan Active Problems: Bradycardia Dementia H/o TIA   1. Bradycardia associated with syncopal episode - cardiology on board and managing - patient is on Aricept at home for dementia medication has listed side effect of bradycardia as such will hold -TSH currently pending, magnesium level within normal  limits, will also check phosphorus level, calcium levels within normal limits - given history wall in the ED the most likely cause of the syncopal episodes is bradycardia  - Troponin q 6 hours x 3  2. Dementia - Hold Aricept due to #1 - we'll plan on continuing Remeron and Effexor  3. History of TIA - We'll plan on continuing aspirin and Zocor  4. DVT prophylaxis: Given possibility of permanent pacemaker placementWill place on SCDs and strict bedrest at this point  Code Status: prior CODE STATUS full. Presumed full Family Communication: no family at bedside Disposition Plan: pending results of lab tests as well as recommendations from cardiologist.  Time spent: more than 60 minutes  Penny Pia [facility] Pager [phone]  If 7PM-7AM, please contact night-coverage [email or web address] Password [email or web address] 03/28/2013, 4:14 PM

## 2013-03-28 NOTE — Progress Notes (Signed)
MD NOTIFIED FOR PATIENT REFUSAL TO HAVE BLOOD STUDIES DONE AND OTHER SERVICES.  SEVERAL UNSUCCESSFUL ATTEMPTS TO REACH DAUGHTERS BY PHONE.  WILL CONTINUE TO TRY.

## 2013-03-28 NOTE — ED Notes (Signed)
Ambulatory. Alert and oriented x 4. Per EMS pt went to use bathroom this morning, vomited and then and a near syncopal episode. 4 zofran IV given by EMS enroute to hospital. Vitals 160/80, 18, 84, SR on monitor. EMS reports ? Aspiration of vomitus as some rhonchi was heard in RLL lung field. 20g PIV placed by EMS. Vwilliams,rn.

## 2013-03-28 NOTE — ED Notes (Signed)
[facility] here to transport pt to [facility]  

## 2013-03-28 NOTE — ED Notes (Signed)
Bed: [facility] Expected date:  Expected time:  Means of arrival:  Comments: NV, elderly

## 2013-03-28 NOTE — ED Notes (Signed)
Pt from [facility] memory care unit at [facility]. Brought in by EMS with vomiting. Had an episode of moderate amount of yellowish vomitus upon arrival in the hallway of ED. Denies abdominal discomfort or pain . Denies generalized pain. Alert and oriented. Respiration even and unlabored. Rhonchi heard in RLL of lungs otherwise diminished breathsounds. O2 85% on RA. Pt placed on 4l. sats up to . Weaned to 2l in 10 minutes after activities. Remains at 100% on 2l. Resting quietly at this time. Vwilliams,rn.

## 2013-03-28 NOTE — ED Notes (Signed)
Spoke with pt's daughter, Okey Regal - number [phone])

## 2013-03-28 NOTE — ED Notes (Signed)
Patient's daughter contacted. Left a message. Daughter returned call at 1125. Daughter updated on patient's status. Daughter reports pt is from [facility] in [place]. Reports mother has had frequent mini strokes.

## 2013-03-29 ENCOUNTER — Encounter (HOSPITAL_COMMUNITY)
Admission: EM | Disposition: A | Discharge status: Home Health | Payer: Self-pay | Source: Home / Self Care | Attending: Internal Medicine

## 2013-03-29 DIAGNOSIS — R55 Syncope and collapse: Secondary | ICD-10-CM

## 2013-03-29 DIAGNOSIS — I495 Sick sinus syndrome: Secondary | ICD-10-CM

## 2013-03-29 HISTORY — PX: PERMANENT PACEMAKER INSERTION: SHX5480

## 2013-03-29 HISTORY — PX: PACEMAKER INSERTION: SHX728

## 2013-03-29 LAB — CBC
HCT: 36.7 % (ref 36.0–46.0)
MCH: 28.5 pg (ref 26.0–34.0)
MCHC: 33.5 g/dL (ref 30.0–36.0)
MCV: 85.2 fL (ref 78.0–100.0)
Platelets: 240 10*3/uL (ref 150–400)
RDW: 13.5 % (ref 11.5–15.5)
WBC: 9.7 10*3/uL (ref 4.0–10.5)

## 2013-03-29 LAB — BASIC METABOLIC PANEL
BUN: 12 mg/dL (ref 6–23)
CO2: 28 mEq/L (ref 19–32)
Calcium: 8.8 mg/dL (ref 8.4–10.5)
Creatinine, Ser: 0.56 mg/dL (ref 0.50–1.10)
GFR calc non Af Amer: 80 mL/min — ABNORMAL LOW (ref >90)
Glucose, Bld: 107 mg/dL — ABNORMAL HIGH (ref 70–99)
Sodium: 141 mEq/L (ref 135–145)

## 2013-03-29 LAB — TROPONIN I
Troponin I: <0.3 ng/mL (ref <0.30)
Troponin I: <0.3 ng/mL (ref <0.30)

## 2013-03-29 SURGERY — PERMANENT PACEMAKER INSERTION
Anesthesia: LOCAL

## 2013-03-29 MED ORDER — SODIUM CHLORIDE 0.9 % IJ SOLN
3.0000 mL | Freq: Two times a day (BID) | INTRAMUSCULAR | Status: DC
  Administered 2013-03-29: 3 mL via INTRAVENOUS
  Administered 2013-03-30: 13:00:00 via INTRAVENOUS
  Administered 2013-03-30 – 2013-04-05 (×12): 3 mL via INTRAVENOUS

## 2013-03-29 MED ORDER — SODIUM CHLORIDE 0.9 % IV SOLN: 250.0000 mL | INTRAVENOUS | Status: DC | PRN

## 2013-03-29 MED ORDER — HYDROCODONE-ACETAMINOPHEN 5-325 MG PO TABS: 1.0000 | ORAL_TABLET | ORAL | Status: DC | PRN

## 2013-03-29 MED ORDER — SODIUM CHLORIDE 0.9 % IJ SOLN: 3.0000 mL | INTRAMUSCULAR | Status: DC | PRN

## 2013-03-29 MED ORDER — ONDANSETRON HCL 4 MG/2ML IJ SOLN: 4.0000 mg | Freq: Four times a day (QID) | INTRAMUSCULAR | Status: DC | PRN

## 2013-03-29 MED ORDER — ACETAMINOPHEN 325 MG PO TABS
325.0000 mg | ORAL_TABLET | ORAL | Status: DC | PRN
  Administered 2013-03-31: 650 mg via ORAL
  Filled 2013-03-29 (×2): qty 2

## 2013-03-29 MED ORDER — LIDOCAINE HCL (PF) 1 % IJ SOLN
INTRAMUSCULAR | Status: AC
  Filled 2013-03-29: qty 60

## 2013-03-29 MED ORDER — CEFAZOLIN SODIUM-DEXTROSE 2-3 GM-% IV SOLR
2.0000 g | INTRAVENOUS | Status: DC
  Filled 2013-03-29: qty 50

## 2013-03-29 MED ORDER — CEFAZOLIN SODIUM 1-5 GM-% IV SOLN
1.0000 g | Freq: Four times a day (QID) | INTRAVENOUS | Status: AC
  Administered 2013-03-29 – 2013-03-30 (×3): 1 g via INTRAVENOUS
  Filled 2013-03-29 (×3): qty 50

## 2013-03-29 MED ORDER — CHLORHEXIDINE GLUCONATE 4 % EX LIQD
60.0000 mL | Freq: Once | CUTANEOUS | Status: AC
  Administered 2013-03-29: 4 via TOPICAL

## 2013-03-29 MED ORDER — SODIUM CHLORIDE 0.45 % IV SOLN
INTRAVENOUS | Status: DC
  Administered 2013-03-29: 10 mL/h via INTRAVENOUS

## 2013-03-29 MED ORDER — CHLORHEXIDINE GLUCONATE 4 % EX LIQD
CUTANEOUS | Status: AC
  Administered 2013-03-29: 09:00:00
  Filled 2013-03-29: qty 15

## 2013-03-29 MED ORDER — MUPIROCIN 2 % EX OINT
1.0000 "application " | TOPICAL_OINTMENT | Freq: Two times a day (BID) | CUTANEOUS | Status: AC
  Administered 2013-03-29 – 2013-04-02 (×10): 1 via NASAL
  Filled 2013-03-29 (×5): qty 22

## 2013-03-29 MED ORDER — CHLORHEXIDINE GLUCONATE CLOTH 2 % EX PADS
6.0000 | MEDICATED_PAD | Freq: Every day | CUTANEOUS | Status: AC
  Administered 2013-03-29 – 2013-04-02 (×5): 6 via TOPICAL

## 2013-03-29 MED ORDER — SODIUM CHLORIDE 0.9 % IV SOLN
INTRAVENOUS | Status: DC
  Administered 2013-03-29: 50 mL/h via INTRAVENOUS

## 2013-03-29 MED ORDER — GENTAMICIN SULFATE 40 MG/ML IJ SOLN
80.0000 mg | INTRAMUSCULAR | Status: DC
  Filled 2013-03-29: qty 2

## 2013-03-29 NOTE — Progress Notes (Signed)
   SUBJECTIVE: The patient is sleeping but without complaint today.  At this time, she denies chest pain, shortness of breath, or any new concerns. S/p pacemaker implant 03-29-2013 for SSS and syncope. CXR reveals small to moderate pneumothorax. some hypoxia last night. She is mildly confused this morning.   CURRENT MEDICATIONS: . bisacodyl  5 mg Oral QHS  .  ceFAZolin (ANCEF) IV  1 g Intravenous Q6H  . Chlorhexidine Gluconate Cloth  6 each Topical Q0600  . mirtazapine  15 mg Oral QHS  . mupirocin ointment  1 application Nasal BID  . simvastatin  20 mg Oral QHS  . sodium chloride  3 mL Intravenous Q12H  . venlafaxine XR  150 mg Oral Daily      OBJECTIVE: Physical Exam: Filed Vitals:   03/30/13 0345 03/30/13 0350 03/30/13 0405 03/30/13 0500  BP:      Pulse:      Temp:      TempSrc:      Resp:   22 20  Height:      Weight:      SpO2: 88% 97% 94% 94%    Intake/Output Summary (Last 24 hours) at 03/30/13 6045 Last data filed at 03/29/13 2045  Gross per 24 hour  Intake 381.67 ml  Output    325 ml  Net  56.67 ml    Telemetry reveals sinus rhythm with intermittent ventricular pacing  GEN- The patient is well appearing, alert and oriented x 3 today.   Head- normocephalic, atraumatic Eyes-  Sclera clear, conjunctiva pink Ears- hearing intact Oropharynx- clear Neck- supple, no JVP Lymph- no cervical lymphadenopathy Lungs- decreased L sided BS,  Normal WOB Heart- Regular rate and rhythm,  GI- soft, NT, ND, + BS Extremities- no clubbing, cyanosis, or edema Skin- no rash or lesion Psych- euthymic mood, full affect Neuro- strength and sensation are intact [place] site is without hematoma  LABS: Basic Metabolic Panel:  Recent Labs  40/98/11 1122 03/29/13 0500  NA 138 141  K 4.2 3.8  CL 101 106  CO2 30 28  GLUCOSE 135* 107*  BUN 20 12  CREATININE 0.68 0.56  CALCIUM 9.6 8.8  MG 2.4  --    CBC:  Recent Labs  03/28/13 1122 03/29/13 0500  WBC 8.7 9.7    NEUTROABS 6.9  --   HGB 13.9 12.3  HCT 41.1 36.7  MCV 85.6 85.2  PLT 259 240   Cardiac Enzymes:  Recent Labs  03/28/13 1122 03/28/13 2320 03/29/13 0500  TROPONINI <0.30 <0.30 <0.30   Thyroid Function Tests:  Recent Labs  03/28/13 1122  TSH 0.597    RADIOLOGY: 03-30-2013 CXR Interval development of a small to moderate-sized left-sided pneumothorax post left anterior chest wall pacemaker implantation.  Device interrogation - reviewed and device function normal - see paper chart.  ASSESSMENT AND PLAN:  Active Problems:   Bradycardia   Syncope   Dementia with behavioral disturbance   History of TIA (transient ischemic attack)   SSS (sick sinus syndrome)  1. Sick sinus syndrome Normal pacemaker function  2. Mild to moderate pneumothorax Clinically stable Will place on high flow O2 and follow Would like to avoid chest tube if possible  3. Confusion Stable Per primary team

## 2013-03-29 NOTE — Progress Notes (Signed)
[facility] PROGRESS NOTE  Amy Wilkinson:[id number] DOB: Jul 07, 1921 DOA: 03/28/2013 PCP: Allean Found, MD  Assessment/Plan:  1. Bradycardia: Most likely from sick sinus syndrome, associated with syncopal episodes - cardiology/EP on board and managing - Aricept discontinued given listed side effect.  Patient still having 10 second pauses per reports.   -TSH within normal, magnesium level within normal limits, calcium levels within normal limits  - Syncopal episodes most likely related to new diagnosis or Sick Sinus Syndrome - Troponin q 6 hours x 3: Negative.  2. Dementia  - Hold Aricept due to #1  - we'll plan on continuing Remeron and Effexor   3. History of TIA  - We'll plan on continuing aspirin and Zocor   4. DVT prophylaxis: Given possibility of permanent pacemaker placement will place on SCDs and strict bedrest at this point  Code Status: full (Patient has living will which we are in process of retrieving) Family Communication: Discussed with [name] (daughter) and Son [name] (son in [place]), [name] reports that mother still has quality of life despite dementia and if risk of mortality is greater with no intervention he would like to have pacemaker placed. He does report that he is not health care power of attorney but states that the his brother and sister would agree with the decision.   [name] phone number [phone]  Disposition Plan: Per my discussion with son [name], for pacemaker placement.  Consultants:  Cardiology  Procedures:  None  Initially had transcutaneous pacemaker   Antibiotics:  Ancef  Gentamycin irrigation  HPI/Subjective: No new syncopal episodes or pauses this morning per my discussion with Nursing.  Discussed with daughter [name] and son [name]. This am.  Objective: Filed Vitals:   03/29/13 0900  BP: 128/38  Pulse: 74  Temp:   Resp: 21    Intake/Output Summary (Last 24 hours) at 03/29/13 0956 Last data filed  at 03/29/13 0900  Gross per 24 hour  Intake   1720 ml  Output    200 ml  Net   1520 ml   Filed Weights   03/28/13 1556 03/29/13 0800  Weight: 58.2 kg (128 lb 4.9 oz) 59.8 kg (131 lb 13.4 oz)    Exam:   General:  Pt in NAD, alert and awake  Cardiovascular: RRR, no rubs  Respiratory: CTA BL, no wheezes  Abdomen: soft, NT, ND  Musculoskeletal: no cyanosis   Data Reviewed: Basic Metabolic Panel:  Recent Labs Lab 03/28/13 1122 03/29/13 0500  NA 138 141  K 4.2 3.8  CL 101 106  CO2 30 28  GLUCOSE 135* 107*  BUN 20 12  CREATININE 0.68 0.56  CALCIUM 9.6 8.8  MG 2.4  --    Liver Function Tests: No results found for this basename: AST, ALT, ALKPHOS, BILITOT, PROT, ALBUMIN,  in the last 168 hours No results found for this basename: LIPASE, AMYLASE,  in the last 168 hours No results found for this basename: AMMONIA,  in the last 168 hours CBC:  Recent Labs Lab 03/28/13 1122 03/29/13 0500  WBC 8.7 9.7  NEUTROABS 6.9  --   HGB 13.9 12.3  HCT 41.1 36.7  MCV 85.6 85.2  PLT 259 240   Cardiac Enzymes:  Recent Labs Lab 03/28/13 1122 03/28/13 2320 03/29/13 0500  TROPONINI <0.30 <0.30 <0.30   BNP (last 3 results) No results found for this basename: PROBNP,  in the last 8760 hours CBG: No results found for this basename: GLUCAP,  in the last 168  hours  Recent Results (from the past 240 hour(s))  MRSA PCR SCREENING     Status: Abnormal   Collection Time    03/28/13  3:59 PM      Result Value Range Status   MRSA by PCR POSITIVE (*) NEGATIVE Final   Comment:            The GeneXpert MRSA Assay (FDA     approved for NASAL specimens     only), is one component of a     comprehensive MRSA colonization     surveillance program. It is not     intended to diagnose MRSA     infection nor to guide or     monitor treatment for     MRSA infections.     RESULT CALLED TO, READ BACK BY AND VERIFIED WITH:     S.[facility] 1804 03/28/13 M.Louthan     Studies: Dg  Chest Portable 1 View  03/28/2013   CLINICAL DATA:  Status post code blue:  Emesis, bradycardia  EXAM: PORTABLE CHEST - 1 VIEW  COMPARISON:  Prior chest x-ray 01/09/2012; prior CT chest 03/31/2012  FINDINGS: External defibrillator pads project over the left chest limiting evaluation of the left base. The cardiac and mediastinal contours are stable and within normal limits. No focal airspace consolidation. Chronic bronchitic changes and prominence of the interstitial markings are similar compared to prior. Atherosclerotic calcifications are noted in the transverse aorta. No pneumothorax. Low inspiratory volumes with mild right basilar atelectasis and probable left basilar atelectasis. No acute osseous abnormality.  IMPRESSION: No definite acute active disease. Limited evaluation of the left base secondary to obscuring external defibrillator pads.  Slightly low inspiratory volumes with mild right basilar atelectasis.   Electronically Signed   By: Malachy Moan M.D.   On: 03/28/2013 11:38    Scheduled Meds: . aspirin EC  81 mg Oral Daily  . bisacodyl  5 mg Oral QHS  .  ceFAZolin (ANCEF) IV  2 g Intravenous On Call  . Chlorhexidine Gluconate Cloth  6 each Topical Q0600  . gentamicin irrigation  80 mg Irrigation On Call  . mirtazapine  15 mg Oral QHS  . mupirocin ointment  1 application Nasal BID  . simvastatin  20 mg Oral QHS  . sodium chloride  3 mL Intravenous Q12H  . venlafaxine XR  150 mg Oral Daily   Continuous Infusions: . sodium chloride 10 mL/hr (03/29/13 0830)  . sodium chloride 75 mL/hr at 03/29/13 0116  . sodium chloride 50 mL/hr (03/29/13 0849)    Active Problems:   Bradycardia   Syncope   Dementia with behavioral disturbance   History of TIA (transient ischemic attack)   SSS (sick sinus syndrome)    Time spent: > 35 minutes    Penny Pia  [facility] Pager [phone]. If 7PM-7AM, please contact night-coverage at [email or web address], password [facility] 03/29/2013, 9:56  AM  LOS: 1 day

## 2013-03-29 NOTE — Interval H&P Note (Signed)
History and Physical Interval Note:  03/29/2013 2:01 PM  Amy Wilkinson  has presented today for surgery, with the diagnosis of bradicardia  The various methods of treatment have been discussed with the patient and family. After consideration of risks, benefits and other options for treatment, the patient has consented to  Procedure(s): PERMANENT PACEMAKER INSERTION (N/A) as a surgical intervention .  The patient's history has been reviewed, patient examined, no change in status, stable for surgery.  I have reviewed the patient's chart and labs.  Questions were answered to the patient's satisfaction.     Hillis Range

## 2013-03-29 NOTE — Progress Notes (Signed)
Doing well s/p PPM implant Will transfer to telemetry Anticipate possible discharge in am if no concerns overnight.

## 2013-03-29 NOTE — H&P (View-Only) (Signed)
Primary Care Physician: Allean Found, MD Referring Physician:  Dr Ladell Heads Amy Wilkinson is a 79 y.o. female with a h/o dementia and prior TIA who now presents with syncope.  She is documented to have multiple pauses > 5 seconds.  No reversible causes have been found.  She is presently without complaint but with dementia has difficulty providing additional history.  Gathering from ED provider notes, she resides at [facility]. She has advanced dementia and received specialized care for this at her facility. She had an episode of syncopized briefly. She was transported to [facility] ED.   She had another episode of syncope in the [facility] ED with associated bradycardia and 10 s pause per report. She was transferred to [facility]. Telemetry review indicates two episodes of sinus bradycardia to 40 bpm, then sinus pauses > 10 s.  She currently denies chest pain now or previously. Denies shortness of breath.   No family member present to gather additional components of the HPI.    Past Medical History  Diagnosis Date  . Dementia   . Mixed hyperlipidemia   . Glaucoma   . TIA (transient ischemic attack)    Past Surgical History  Procedure Laterality Date  . Abdominal hysterectomy    . Cholecystectomy      Current Facility-Administered Medications  Medication Dose Route Frequency Provider Last Rate Last Dose  . 0.9 %  sodium chloride infusion   Intravenous Continuous Penny Pia, MD 75 mL/hr at 03/29/13 0116    . acetaminophen (TYLENOL) tablet 650 mg  650 mg Oral Q4H PRN Penny Pia, MD      . aspirin EC tablet 81 mg  81 mg Oral Daily Penny Pia, MD      . bisacodyl (DULCOLAX) EC tablet 5 mg  5 mg Oral QHS Penny Pia, MD   5 mg at 03/28/13 2231  . Chlorhexidine Gluconate Cloth 2 % PADS 6 each  6 each Topical Q0600 Lonia Blood, MD      . LORazepam (ATIVAN) tablet 0.5-0.75 mg  0.5-0.75 mg Oral Q6H PRN Penny Pia, MD      . mirtazapine (REMERON) tablet 15 mg  15 mg Oral  QHS Penny Pia, MD   15 mg at 03/28/13 2231  . mupirocin ointment (BACTROBAN) 2 % 1 application  1 application Nasal BID Lonia Blood, MD      . simvastatin (ZOCOR) tablet 20 mg  20 mg Oral QHS Penny Pia, MD   20 mg at 03/28/13 2231  . sodium chloride 0.9 % injection 3 mL  3 mL Intravenous Q12H Penny Pia, MD      . venlafaxine XR (EFFEXOR-XR) 24 hr capsule 150 mg  150 mg Oral Daily Penny Pia, MD        No Known Allergies  History   Social History  . Marital Status: Widowed    Spouse Name: N/A    Number of Children: N/A  . Years of Education: N/A   Occupational History  . Not on file.   Social History Main Topics  . Smoking status: Never Smoker   . Smokeless tobacco: Not on file  . Alcohol Use: No  . Drug Use: No  . Sexual Activity: No   Other Topics Concern  . Not on file   Social History Narrative  . No narrative on file    FH- Pt unable to provide  ROS- pt unable to provide  Physical Exam: Filed Vitals:   03/29/13 0400 03/29/13  0500 03/29/13 0600 03/29/13 0700  BP: 114/44 135/54 110/45 146/54  Pulse: 85 83 73 64  Temp:      TempSrc:      Resp: 12 16 13 20   Height:      Weight:      SpO2: 92% 100% 100% 100%    GEN- The patient is elderly appearing, sleeping but rouses Head- normocephalic, atraumatic Eyes-  Sclera clear, conjunctiva pink Ears- hearing intact Oropharynx- clear Neck- supple, no JVP Lungs- Clear to ausculation bilaterally, normal work of breathing Heart- Regular rate and rhythm, no murmurs, rubs or gallops, PMI not laterally displaced GI- soft, NT, ND, + BS Extremities- no clubbing, cyanosis, or edema MS- age appropriate atrophy  EKG today reveals sinus rhythm 69 bpm, otherwise normal ekg Echo pending  Assessment and Plan:  1. Sick sinus syndrome with syncope The patient has symptomatic bradycardia.  This includes pauses with syncope.  I would therefore recommend pacemaker implantation at this time.  Risks, benefits,  alternatives to pacemaker implantation were discussed in detail with the patient today. She is willing to proceed.  Given her dementia, we will need to obtain consent from her next of kin.

## 2013-03-29 NOTE — Op Note (Signed)
SURGEON:  Hillis Range, MD     PREPROCEDURE DIAGNOSIS:  Symptomatic sinus node dysfunction    POSTPROCEDURE DIAGNOSIS:  Symptomatic sinus node dysfunction     PROCEDURES:   1.   Pacemaker implantation.     INTRODUCTION:  Amy Wilkinson is a 79 y.o. female with a history of sinus node dysfunction and syncope who presents today for pacemaker implantation.  The patient has been observed to have several recent episodes of syncope.  Telemetry reveals sinus pauses of 10 seconds during episodes of syncope.  No reversible causes have been identified.  The patient therefore presents today for pacemaker implantation.     DESCRIPTION OF PROCEDURE:  Informed written consent was obtained, and  the patient was brought to the electrophysiology lab in a fasting state.  The patient required no sedation for the procedure today.  The patients left chest was prepped and draped in the usual sterile fashion by the EP lab staff. The skin overlying the left deltopectoral region was infiltrated with lidocaine for local analgesia.  A 4-cm incision was made over the left deltopectoral region.  A left subcutaneous pacemaker pocket was fashioned using a combination of sharp and blunt dissection. Electrocautery was required to assure hemostasis.    RA/RV Lead Placement: The left axillary vein was therefore cannulated.  Through the left axillary vein, a [employer] Isoflex model [id number] (serial number  [id number]) right atrial lead and a [employer] Isoflex model [phone] (serial number  [id number]) right ventricular lead were advanced with fluoroscopic visualization into the right atrial appendage and right ventricular apex positions respectively.  Initial atrial lead P- waves measured 6.9 mV with impedance of 532 ohms and a threshold of 0.5 V at 0.4 msec.  Right ventricular lead R-waves measured 16 mV with an impedance of 833 ohms and a threshold of 0.5 V at 0.4 msec.  Both leads were secured to the pectoralis fascia  using #2-0 silk over the suture sleeves.   Device Placement:  The leads were then connected to a [facility] Assurity DR  model [id number] (serial number  [id number] ) pacemaker.  The pocket was irrigated with copious gentamicin solution.  The pacemaker was then placed into the pocket.  The pocket was then closed in 2 layers with 2.0 Vicryl suture for the subcutaneous and subcuticular layers.  Steri-  Strips and a sterile dressing were then applied.  There were no early apparent complications.     CONCLUSIONS:   1. Successful implantation of a [employer] Jude Medical Assurity DR  dual-chamber pacemaker for symptomatic sinus bradycardia  2. No early apparent complications.           Hillis Range, MD 03/29/2013 2:42 PM

## 2013-03-29 NOTE — Clinical Social Work Note (Signed)
CSW consulted to help find POA for pt--from MD note, they were able to contact pt's daughter [name] [phone] & [phone]) and son [name] [phone]). No further CSW needs identified; CSW signing off and will provide services if re-consulted.   Maryclare Labrador, MSW, [facility] Clinical Social Worker [phone]

## 2013-03-29 NOTE — Progress Notes (Signed)
*   Echocardiogram 2D Echocardiogram has been performed.  Dorothey Baseman 03/29/2013, 9:58 AM

## 2013-03-29 NOTE — Consult Note (Signed)
Primary Care Physician: SMITH,CANDACE THIELE, MD Referring Physician:  Dr McDowell   Amy Wilkinson is a 79 y.o. female with a h/o dementia and prior TIA who now presents with syncope.  She is documented to have multiple pauses > 5 seconds.  No reversible causes have been found.  She is presently without complaint but with dementia has difficulty providing additional history.  Gathering from ED provider notes, she resides at [facility]. She has advanced dementia and received specialized care for this at her facility. She had an episode of syncopized briefly. She was transported to [facility] ED.   She had another episode of syncope in the [facility] ED with associated bradycardia and 10 s pause per report. She was transferred to [place] [facility]U. Telemetry review indicates two episodes of sinus bradycardia to 40 bpm, then sinus pauses > 10 s.  She currently denies chest pain now or previously. Denies shortness of breath.   No family member present to gather additional components of the HPI.    Past Medical History  Diagnosis Date  . Dementia   . Mixed hyperlipidemia   . Glaucoma   . TIA (transient ischemic attack)    Past Surgical History  Procedure Laterality Date  . Abdominal hysterectomy    . Cholecystectomy      Current Facility-Administered Medications  Medication Dose Route Frequency Provider Last Rate Last Dose  . 0.9 %  sodium chloride infusion   Intravenous Continuous Orlando Vega, MD 75 mL/hr at 03/29/13 0116    . acetaminophen (TYLENOL) tablet 650 mg  650 mg Oral Q4H PRN Orlando Vega, MD      . aspirin EC tablet 81 mg  81 mg Oral Daily Orlando Vega, MD      . bisacodyl (DULCOLAX) EC tablet 5 mg  5 mg Oral QHS Orlando Vega, MD   5 mg at 03/28/13 2231  . Chlorhexidine Gluconate Cloth 2 % PADS 6 each  6 each Topical Q0600 Jeffrey T McClung, MD      . LORazepam (ATIVAN) tablet 0.5-0.75 mg  0.5-0.75 mg Oral Q6H PRN Orlando Vega, MD      . mirtazapine (REMERON) tablet 15 mg  15 mg Oral  QHS Orlando Vega, MD   15 mg at 03/28/13 2231  . mupirocin ointment (BACTROBAN) 2 % 1 application  1 application Nasal BID Jeffrey T McClung, MD      . simvastatin (ZOCOR) tablet 20 mg  20 mg Oral QHS Orlando Vega, MD   20 mg at 03/28/13 2231  . sodium chloride 0.9 % injection 3 mL  3 mL Intravenous Q12H Orlando Vega, MD      . venlafaxine XR (EFFEXOR-XR) 24 hr capsule 150 mg  150 mg Oral Daily Orlando Vega, MD        No Known Allergies  History   Social History  . Marital Status: Widowed    Spouse Name: N/A    Number of Children: N/A  . Years of Education: N/A   Occupational History  . Not on file.   Social History Main Topics  . Smoking status: Never Smoker   . Smokeless tobacco: Not on file  . Alcohol Use: No  . Drug Use: No  . Sexual Activity: No   Other Topics Concern  . Not on file   Social History Narrative  . No narrative on file    FH- Pt unable to provide  ROS- pt unable to provide  Physical Exam: Filed Vitals:   03/29/13 0400 03/29/13   0500 03/29/13 0600 03/29/13 0700  BP: 114/44 135/54 110/45 146/54  Pulse: 85 83 73 64  Temp:      TempSrc:      Resp: 12 16 13 20  Height:      Weight:      SpO2: 92% 100% 100% 100%    GEN- The patient is elderly appearing, sleeping but rouses Head- normocephalic, atraumatic Eyes-  Sclera clear, conjunctiva pink Ears- hearing intact Oropharynx- clear Neck- supple, no JVP Lungs- Clear to ausculation bilaterally, normal work of breathing Heart- Regular rate and rhythm, no murmurs, rubs or gallops, PMI not laterally displaced GI- soft, NT, ND, + BS Extremities- no clubbing, cyanosis, or edema MS- age appropriate atrophy  EKG today reveals sinus rhythm 69 bpm, otherwise normal ekg Echo pending  Assessment and Plan:  1. Sick sinus syndrome with syncope The patient has symptomatic bradycardia.  This includes pauses with syncope.  I would therefore recommend pacemaker implantation at this time.  Risks, benefits,  alternatives to pacemaker implantation were discussed in detail with the patient today. She is willing to proceed.  Given her dementia, we will need to obtain consent from her next of kin.   

## 2013-03-29 NOTE — Care Management Note (Addendum)
    Page 1 of 2   04/06/2013     2:49:45 PM   CARE MANAGEMENT NOTE 04/06/2013  Patient:  Amy Wilkinson, Amy Wilkinson   Account Number:  [id number]  Date Initiated:  03/29/2013  Documentation initiated by:  Junius Creamer  Subjective/Objective Assessment:   adm w syncope and pauses     Action/Plan:   lives at [facility], pcp dr Merri Brunette   Anticipated DC Date:  04/06/2013   Anticipated DC Plan:  ASSISTED LIVING / REST HOME  In-house referral  Clinical Social Worker      DC Associate Professor  CM consult      [facility] Choice  Resumption Of Svcs/PTA Provider   Choice offered to / List presented to:          [facility] arranged  HH-1 RN  HH-2 PT  HH-3 OT  HH-4 NURSE'S AIDE      HH agency  [facility]   Status of service:  Completed, signed off Medicare Important Message given?   (If response is "NO", the following Medicare IM given date fields will be blank) Date Medicare IM given:   Date Additional Medicare IM given:    Discharge Disposition:  ASSISTED LIVING  Per UR Regulation:  Reviewed for med. necessity/level of care/duration of stay  If discussed at Long Length of Stay Meetings, dates discussed:   04/06/2013    Comments:  04/06/13- 1300- Donn Pierini RN, BSN -[phone] Received call from Eunice Blase with [facility]- pt was active with them at her ALF prior to admission- will need resumption orders for HH-RN/PT/OT/aide- plan is for pt to return to [facility]F today- CSW following- per bedside RN HH orders in epic- [facility] notified of orders.   04-05-13 [address], [place] [phone] Plan will be for SNF once pt is medically stable. CXR this am shows The left pneumothorax has enlarged and is now 15%. CM will continue to monitor for disposition needs.   10/23 1032a debbie dowell rn,bsn pt from [facility].

## 2013-03-29 NOTE — Progress Notes (Addendum)
Pt transported to cath lab for pacemaker placement.

## 2013-03-30 ENCOUNTER — Inpatient Hospital Stay (HOSPITAL_COMMUNITY): Payer: Medicare Other

## 2013-03-30 ENCOUNTER — Other Ambulatory Visit: Payer: Self-pay

## 2013-03-30 DIAGNOSIS — J95811 Postprocedural pneumothorax: Secondary | ICD-10-CM | POA: Diagnosis not present

## 2013-03-30 DIAGNOSIS — S270XXA Traumatic pneumothorax, initial encounter: Secondary | ICD-10-CM

## 2013-03-30 MED ORDER — HYDRALAZINE HCL 20 MG/ML IJ SOLN
5.0000 mg | INTRAMUSCULAR | Status: DC | PRN
  Administered 2013-03-31 (×2): 5 mg via INTRAVENOUS
  Filled 2013-03-30 (×2): qty 1

## 2013-03-30 NOTE — Procedures (Signed)
Chest Tube Insertion Procedure Note  Indications:  Clinically significant Pneumothorax  Pre-operative Diagnosis: Pneumothorax  Post-operative Diagnosis: Pneumothorax  Procedure Details  Informed consent was obtained for the procedure, including sedation.  Risks of lung perforation, hemorrhage, arrhythmia, and adverse drug reaction were discussed.   After sterile skin prep, using standard technique, a 14 French tube was placed in the left lateral rib space.  Findings: None  Estimated Blood Loss:  Minimal         Specimens:  None              Complications:  None; patient tolerated the procedure well.         Disposition: ICU - extubated and stable.         Condition: stable  Attending Attestation: I was present and scrubbed for the entire procedure.  Toni Amend [facility] Physician Assistant Student 03/30/13, 3:15 pm  Patient seen and examined, agree with above note.  I dictated the care and orders written for this patient under my direction.  Alyson Reedy, MD [phone]

## 2013-03-30 NOTE — Progress Notes (Signed)
NP on call notified of pt's O2 sats dropping to the 70s. When getting am VS the pt's O2 sats was noted to be in the 70s per the NT. Upon arrival to the room pt had an O2 sat of 75% on RA. Pt was placed on 4L [place] sats went up to 88%. Pt was placed on a venti-mask at 50% and her O2 sats went up to 97%. Pt was weaned down to 3.5 L [place] and currently has sats between 92-96%. Pt was placed on a continuous pulse ox. At no time was the patient in any distress. Pt's color never changed & she was never SOB. Pt was alert to self, place, & situation. Will continue to monitor the pt. Sanda Linger, RN

## 2013-03-30 NOTE — Consult Note (Signed)
PULMONARY  / CRITICAL CARE MEDICINE  Name: Amy Wilkinson MRN: [id number] DOB: [birth year]    ADMISSION DATE:  03/28/2013 CONSULTATION DATE: 10-20  REFERRING MD :  Alred PRIMARY SERVICE: [facility]  CHIEF COMPLAINT:  Hypoxia  BRIEF PATIENT DESCRIPTION: 79 yo demented WF with SSS who underwent PPM left on 10-20. She developed hypoxia am of 10-21 and required increasing fio2. CxR revealed moderate left pnx and PCCM asked to evaluate. Tx to ICU and place wayne pnx chest tube.   SIGNIFICANT EVENTS / STUDIES:  10-20 left PPM 10-21 left moderate pnx  LINES / TUBES: PIV  CULTURES: None  ANTIBIOTICS: 10-20 ancef>>  HISTORY OF PRESENT ILLNESS:   79 yo demented WF with SSS who underwent PPM left on 10-20. She developed hypoxia am of 10-21 and required increasing fio2. CxR revealed moderate left pnx and PCCM asked to evaluate. Tx to ICU and place wayne pnx chest tube.  PAST MEDICAL HISTORY :  Past Medical History  Diagnosis Date  . Dementia   . Mixed hyperlipidemia   . Glaucoma   . TIA (transient ischemic attack)    Past Surgical History  Procedure Laterality Date  . Abdominal hysterectomy    . Cholecystectomy     Prior to Admission medications   Medication Sig Start Date End Date Taking? Authorizing Provider  acetaminophen (TYLENOL) 325 MG tablet Take 650 mg by mouth every 4 (four) hours as needed. pain   Yes Historical Provider, MD  aspirin EC 81 MG tablet Take 81 mg by mouth daily.   Yes Historical Provider, MD  bisacodyl (BISACODYL) 5 MG EC tablet Take 5 mg by mouth at bedtime.   Yes Historical Provider, MD  donepezil (ARICEPT) 10 MG tablet Take 10 mg by mouth daily.    Yes Historical Provider, MD  LORazepam (ATIVAN) 0.5 MG tablet Take 0.5-0.75 mg by mouth every 6 (six) hours as needed for anxiety (and prior to showers). Anxiety   Yes Historical Provider, MD  mirtazapine (REMERON) 15 MG tablet Take 15 mg by mouth at bedtime.   Yes Historical Provider, MD  simvastatin  (ZOCOR) 20 MG tablet Take 20 mg by mouth at bedtime.   Yes Historical Provider, MD  Skin Protectants, Misc. (EUCERIN) cream Apply 1 application topically daily.   Yes Historical Provider, MD  venlafaxine XR (EFFEXOR-XR) 150 MG 24 hr capsule Take 150 mg by mouth daily.   Yes Historical Provider, MD   No Known Allergies  FAMILY HISTORY:  History reviewed. No pertinent family history. SOCIAL HISTORY:  reports that she has never smoked. She does not have any smokeless tobacco history on file. She reports that she does not drink alcohol or use illicit drugs.  REVIEW OF SYSTEMS:  NA  SUBJECTIVE:   VITAL SIGNS: Temp:  [98 F (36.7 C)-98.5 F (36.9 C)] 98 F (36.7 C) (10/21 0335) Pulse Rate:  [74-90] 90 (10/21 0335) Resp:  [17-29] 20 (10/21 0500) BP: (81-138)/(38-95) 138/56 mmHg (10/21 0335) SpO2:  [75 %-100 %] 94 % (10/21 0500) FiO2 (%):  [50 %] 50 % (10/21 0350) HEMODYNAMICS:   VENTILATOR SETTINGS: Vent Mode:  [-]  FiO2 (%):  [50 %] 50 % INTAKE / OUTPUT: Intake/Output     10/20 0701 - 10/21 0700 10/21 0701 - 10/22 0700   P.O.     I.V. (mL/kg) 381.7 (6.4)    Total Intake(mL/kg) 381.7 (6.4)    Urine (mL/kg/hr) 325 (0.2)    Emesis/NG output     Total Output 325  Net +56.7          Urine Occurrence 2 x      PHYSICAL EXAMINATION: General:  Lethargic elderly wf no apparent distress at rest Neuro:  Dull affect, maex 4. Speech slow but understandable HEENT: NO JVD/LAN Cardiovascular:  HSR Lungs:  Decreased bs left Abdomen: +bs Musculoskeletal:  intact Skin:  warm  LABS:  CBC Recent Labs     03/28/13  1122  03/29/13  0500  WBC  8.7  9.7  HGB  13.9  12.3  HCT  41.1  36.7  PLT  259  240   Coag's No results found for this basename: APTT, INR,  in the last 72 hours BMET Recent Labs     03/28/13  1122  03/29/13  0500  NA  138  141  K  4.2  3.8  CL  101  106  CO2  30  28  BUN  20  12  CREATININE  0.68  0.56  GLUCOSE  135*  107*   Electrolytes Recent  Labs     03/28/13  1122  03/29/13  0500  CALCIUM  9.6  8.8  MG  2.4   --    Sepsis Markers No results found for this basename: LACTICACIDVEN, PROCALCITON, O2SATVEN,  in the last 72 hours ABG No results found for this basename: PHART, PCO2ART, PO2ART,  in the last 72 hours Liver Enzymes No results found for this basename: AST, ALT, ALKPHOS, BILITOT, ALBUMIN,  in the last 72 hours Cardiac Enzymes Recent Labs     03/28/13  1122  03/28/13  2320  03/29/13  0500  TROPONINI  <0.30  <0.30  <0.30   Glucose No results found for this basename: GLUCAP,  in the last 72 hours  Imaging Dg Chest 2 View  03/30/2013   *RADIOLOGY REPORT*  Clinical Data: Post pacemaker implantation  CHEST - 2 VIEW  Comparison: 03/23/2013; 01/09/2012; chest CT - 03/31/2012  Findings:  Grossly unchanged cardiac silhouette and mediastinal contours. Interval placement of left anterior chest wall dual lead pacemaker with lead tips overlying the expected location of the right atrium and ventricle.  Interval development of a small to moderate-sized left-sided pneumothorax.  Mild cephalization of flow without frank evidence of edema.  Trace left-sided pleural effusion.  Unchanged bones.  Post cholecystectomy.  IMPRESSION:  Interval development of a small to moderate-sized left-sided pneumothorax post left anterior chest wall pacemaker implantation.  Critical Value/emergent results were called by telephone at the time of interpretation on 03/30/2013 at 07:39 to [place], [place], who verbally acknowledged these results.   Original Report Authenticated By: Tacey Ruiz, MD   Dg Chest Portable 1 View  03/28/2013   CLINICAL DATA:  Status post code blue:  Emesis, bradycardia  EXAM: PORTABLE CHEST - 1 VIEW  COMPARISON:  Prior chest x-ray 01/09/2012; prior CT chest 03/31/2012  FINDINGS: External defibrillator pads project over the left chest limiting evaluation of the left base. The cardiac and mediastinal contours are stable and within  normal limits. No focal airspace consolidation. Chronic bronchitic changes and prominence of the interstitial markings are similar compared to prior. Atherosclerotic calcifications are noted in the transverse aorta. No pneumothorax. Low inspiratory volumes with mild right basilar atelectasis and probable left basilar atelectasis. No acute osseous abnormality.  IMPRESSION: No definite acute active disease. Limited evaluation of the left base secondary to obscuring external defibrillator pads.  Slightly low inspiratory volumes with mild right basilar atelectasis.   Electronically Signed   By:  Malachy Moan M.D.   On: 03/28/2013 11:38    ASSESSMENT / PLAN:  PULMONARY A: Hypoxia post PPM with Left moderate PNX P:   Continue oxygen F/u CXR Defer chest tube placement for now  CARDIOVASCULAR A:  SSS post pacer 10-20  P:  Per cards  RENAL A:   No acute process P:   F/u BMET as needed Monitor renal fx  GASTROINTESTINAL A:  Nutrition P:   Keep NPO for now in case she needs chest tube >> if f/u CXR in PM of 10/21 stable, then can resume diet  HEMATOLOGIC A:   No acute process P:  F/u CBC as needed  INFECTIOUS A:   Post PPM prophylaxis P:   Ancef per cards  ENDOCRINE A:   No acute process   P:   Monitor blood sugar on BMET  NEUROLOGIC A:   Dementia/Lethargy P:   Monitor mental status  Reviewed above, examined pt.  79 yo female with SSS s/p PM.  Developed progressive hypoxia after procedure.  Found to have moderate Lt PTX.  She currently denies c/o chest pain or dyspnea.  Hemodynamics, and oxygenation stable.    Plan to f/u CXR in PM of 10/21 >> if stable to improved, then defer chest tube placement.  If PTX has increased or if pt develops symptoms/hemodynamic compromise, then will need chest tube placement.  In either scenario will continue increased FiO2 for now.  No family at bedside at time of my examination.  Coralyn Helling, MD [facility] Pulmonary/Critical  Care 03/30/2013, 1:10 PM Pager:  [phone] After 3pm call: [phone]

## 2013-03-30 NOTE — Progress Notes (Signed)
Cardiologist on call notified of pt having a run of what looks like v-tach. It looked as if the pacer was possibly trying to capture. Pt asymptomatic. The pacer site was assessed and the dressing remained Clean, dry, & intact. Will continue to monitor the pt. Will place strip in the pt's chart. Sanda Linger

## 2013-03-30 NOTE — Progress Notes (Signed)
Called and gave result of chest x-ray to Dr. Cena Benton: Small to moderated left pneumothorax. Will continue to monitor pt.

## 2013-03-30 NOTE — Progress Notes (Signed)
Donnamarie Poag NP notified of SBP 180-190's. Pt asymptomatic. New order obtained. Will continue to monitor.

## 2013-03-30 NOTE — Progress Notes (Signed)
[facility] PROGRESS NOTE  Amy Wilkinson WUJ:[id number] DOB: May 06, 1922 DOA: 03/28/2013 PCP: Allean Found, MD  Brief Narrative: The patient is an 79 year old with dementia that presented to the hospital secondary to syncopal episodes. While in the ED was noted to have bradycardia with intermittent pauses as high as 10 seconds reportedly. Cardiology was consult in and on 03/29/2013 patient obtained a permanent pacemaker placement. Following procedure patient was found to have a small to moderate left pneumothorax diagnosed chest x-ray.  Assessment/Plan:  1. pneumothorax, traumatic - will consult pulmonology and defer further management to them -Vital signs currently stable and patient obtaining supplemental oxygen.  2. Bradycardia/sick sinus syndrome - Syncopal episodes most likely related to new diagnosis or Sick Sinus Syndrome - cardiology/EP on board and managing - Aricept discontinued given listed side effect. Spite discontinuing Aricept 10 second pauses were still reported.   -TSH within normal, magnesium level within normal limits, calcium levels within normal limits  - Troponin q 6 hours x 3: Negative.  3. Dementia  - Hold Aricept due to #1  - we'll plan on continuing Remeron and Effexor   4. History of TIA  - We'll plan on continuing aspirin and Zocor   5. DVT prophylaxis: SCDs   Code Status: full (Patient has living will which we are in process of retrieving) Family Communication: Discussed with Amy Wilkinson (daughter) and Son Amy Wilkinson (son in [place]), Amy Wilkinson reports that mother still has quality of life despite dementia and if risk of mortality is greater with no intervention he would like to have pacemaker placed. He does report that he is not health care power of attorney but states that the his brother and sister would agree with the decision.   Amy Wilkinson Speaker phone number [phone]  Disposition Plan: Depending on recommendations of specialists  involved  Consultants:  Cardiology  Pulmonology  Procedures:  None  Initially had transcutaneous pacemaker   Antibiotics:  Ancef  Gentamycin irrigation  HPI/Subjective: Was called this morning for new diagnosis of pneumothorax by nurse. Vital signs reported stable. Patient had nasal swab showing MRSA as such was placed on contact precautions.  Objective: Filed Vitals:   03/30/13 0500  BP:   Pulse:   Temp:   Resp: 20    Intake/Output Summary (Last 24 hours) at 03/30/13 0843 Last data filed at 03/29/13 2045  Gross per 24 hour  Intake 194.17 ml  Output    325 ml  Net -130.83 ml   Filed Weights   03/28/13 1556 03/29/13 0800  Weight: 58.2 kg (128 lb 4.9 oz) 59.8 kg (131 lb 13.4 oz)    Exam:   General:  Pt in NAD, alert and awake  Cardiovascular: RRR, no rubs  Respiratory: Decreased breath sounds when auscultating left lung, no wheezes  Abdomen: soft, NT, ND  Musculoskeletal: no cyanosis   Data Reviewed: Basic Metabolic Panel:  Recent Labs Lab 03/28/13 1122 03/29/13 0500  NA 138 141  K 4.2 3.8  CL 101 106  CO2 30 28  GLUCOSE 135* 107*  BUN 20 12  CREATININE 0.68 0.56  CALCIUM 9.6 8.8  MG 2.4  --    Liver Function Tests: No results found for this basename: AST, ALT, ALKPHOS, BILITOT, PROT, ALBUMIN,  in the last 168 hours No results found for this basename: LIPASE, AMYLASE,  in the last 168 hours No results found for this basename: AMMONIA,  in the last 168 hours CBC:  Recent Labs Lab 03/28/13 1122 03/29/13 0500  WBC 8.7 9.7  NEUTROABS 6.9  --   HGB 13.9 12.3  HCT 41.1 36.7  MCV 85.6 85.2  PLT 259 240   Cardiac Enzymes:  Recent Labs Lab 03/28/13 1122 03/28/13 2320 03/29/13 0500  TROPONINI <0.30 <0.30 <0.30   BNP (last 3 results) No results found for this basename: PROBNP,  in the last 8760 hours CBG: No results found for this basename: GLUCAP,  in the last 168 hours  Recent Results (from the past 240 hour(s))  MRSA PCR  SCREENING     Status: Abnormal   Collection Time    03/28/13  3:59 PM      Result Value Range Status   MRSA by PCR POSITIVE (*) NEGATIVE Final   Comment:            The GeneXpert MRSA Assay (FDA     approved for NASAL specimens     only), is one component of a     comprehensive MRSA colonization     surveillance program. It is not     intended to diagnose MRSA     infection nor to guide or     monitor treatment for     MRSA infections.     RESULT CALLED TO, READ BACK BY AND VERIFIED WITH:     S.[facility] 1804 03/28/13 M.Nadel     Studies: Dg Chest 2 View  03/30/2013   *RADIOLOGY REPORT*  Clinical Data: Post pacemaker implantation  CHEST - 2 VIEW  Comparison: 03/23/2013; 01/09/2012; chest CT - 03/31/2012  Findings:  Grossly unchanged cardiac silhouette and mediastinal contours. Interval placement of left anterior chest wall dual lead pacemaker with lead tips overlying the expected location of the right atrium and ventricle.  Interval development of a small to moderate-sized left-sided pneumothorax.  Mild cephalization of flow without frank evidence of edema.  Trace left-sided pleural effusion.  Unchanged bones.  Post cholecystectomy.  IMPRESSION:  Interval development of a small to moderate-sized left-sided pneumothorax post left anterior chest wall pacemaker implantation.  Critical Value/emergent results were called by telephone at the time of interpretation on 03/30/2013 at 07:39 to [place], [place], who verbally acknowledged these results.   Original Report Authenticated By: Tacey Ruiz, MD   Dg Chest Portable 1 View  03/28/2013   CLINICAL DATA:  Status post code blue:  Emesis, bradycardia  EXAM: PORTABLE CHEST - 1 VIEW  COMPARISON:  Prior chest x-ray 01/09/2012; prior CT chest 03/31/2012  FINDINGS: External defibrillator pads project over the left chest limiting evaluation of the left base. The cardiac and mediastinal contours are stable and within normal limits. No focal airspace  consolidation. Chronic bronchitic changes and prominence of the interstitial markings are similar compared to prior. Atherosclerotic calcifications are noted in the transverse aorta. No pneumothorax. Low inspiratory volumes with mild right basilar atelectasis and probable left basilar atelectasis. No acute osseous abnormality.  IMPRESSION: No definite acute active disease. Limited evaluation of the left base secondary to obscuring external defibrillator pads.  Slightly low inspiratory volumes with mild right basilar atelectasis.   Electronically Signed   By: Malachy Moan M.D.   On: 03/28/2013 11:38    Scheduled Meds: . bisacodyl  5 mg Oral QHS  .  ceFAZolin (ANCEF) IV  1 g Intravenous Q6H  . Chlorhexidine Gluconate Cloth  6 each Topical Q0600  . mirtazapine  15 mg Oral QHS  . mupirocin ointment  1 application Nasal BID  . simvastatin  20 mg Oral QHS  . sodium chloride  3 mL Intravenous  Q12H  . venlafaxine XR  150 mg Oral Daily   Continuous Infusions:    Principal Problem:   Pneumothorax, traumatic Active Problems:   Bradycardia   Syncope   Dementia with behavioral disturbance   History of TIA (transient ischemic attack)   SSS (sick sinus syndrome)    Time spent: > 35 minutes    Penny Pia  [facility] Pager [phone]. If 7PM-7AM, please contact night-coverage at [email or web address], password [email or web address] 03/30/2013, 8:43 AM  LOS: 2 days

## 2013-03-31 ENCOUNTER — Inpatient Hospital Stay (HOSPITAL_COMMUNITY): Payer: Medicare Other

## 2013-03-31 DIAGNOSIS — S279XXS Injury of unspecified intrathoracic organ, sequela: Secondary | ICD-10-CM

## 2013-03-31 MED ORDER — INFLUENZA VAC SPLIT QUAD 0.5 ML IM SUSP
0.5000 mL | Freq: Once | INTRAMUSCULAR | Status: AC
  Administered 2013-03-31: 0.5 mL via INTRAMUSCULAR
  Filled 2013-03-31: qty 0.5

## 2013-03-31 NOTE — Progress Notes (Signed)
Pt wanted flu vaccine this season. Educated, given handout. Administered in right deltoid per pt preference.   Delynn Flavin, RN

## 2013-03-31 NOTE — Consult Note (Signed)
PULMONARY  / CRITICAL CARE MEDICINE  Name: Amy Wilkinson MRN: [id number] DOB: May 19, 1922    ADMISSION DATE:  03/28/2013 CONSULTATION DATE: 10-20  REFERRING MD :  Alred PRIMARY SERVICE: [facility]  CHIEF COMPLAINT:  Hypoxia  BRIEF PATIENT DESCRIPTION: 79 yo demented WF with SSS who underwent PPM left on 10-20. She developed hypoxia am of 10-21 and required increasing fio2. CxR revealed moderate left pnx and PCCM asked to evaluate. Tx to ICU and place wayne pnx chest tube.   SIGNIFICANT EVENTS / STUDIES:  10-20 left PPM 10-21 left moderate pnx 10/21 insertion of left wayne chest tube  LINES / TUBES: PIV 10/21 L chest tube>>  CULTURES: None  ANTIBIOTICS: 10-20 ancef>>  HISTORY OF PRESENT ILLNESS:   79 yo demented WF with SSS who underwent PPM left on 10-20. She developed hypoxia am of 10-21 and required increasing fio2. CxR revealed moderate left pnx and PCCM asked to evaluate. Tx to ICU and place wayne pnx chest tube.  SUBJECTIVE: Patient resting comfortably with no complaints at this time. RN reports good oxygenation and vitals overnight.   VITAL SIGNS: Temp:  [97.5 F (36.4 C)-98.5 F (36.9 C)] 97.9 F (36.6 C) (10/22 0400) Pulse Rate:  [68-105] 95 (10/22 0800) Resp:  [13-24] 16 (10/22 0800) BP: (133-210)/(51-100) 133/64 mmHg (10/22 0800) SpO2:  [91 %-100 %] 98 % (10/22 0800) FiO2 (%):  [100 %] 100 % (10/21 0927) Weight:  [135 lb 2.3 oz (61.3 kg)] 135 lb 2.3 oz (61.3 kg) (10/21 1000) HEMODYNAMICS:   VENTILATOR SETTINGS: Vent Mode:  [-]  FiO2 (%):  [100 %] 100 % INTAKE / OUTPUT: Intake/Output     10/21 0701 - 10/22 0700 10/22 0701 - 10/23 0700   P.O. 180    I.V. (mL/kg)     Total Intake(mL/kg) 180 (2.9)    Urine (mL/kg/hr)     Total Output       Net +180          Urine Occurrence 280 x      PHYSICAL EXAMINATION: General:  Sleeping Neuro:  Alert and unpleasant.  Speech easily understood. Moves all extremities x4 HEENT: NO JVD/LAN, PERLA, MM pink and  moist Cardiovascular:  HSR Lungs:  CTA good breath sounds bilaterally Abdomen: +bs, soft and nontender Musculoskeletal:  intact Skin:  warm  LABS:  CBC Recent Labs     03/28/13  1122  03/29/13  0500  WBC  8.7  9.7  HGB  13.9  12.3  HCT  41.1  36.7  PLT  259  240   Coag's No results found for this basename: APTT, INR,  in the last 72 hours BMET Recent Labs     03/28/13  1122  03/29/13  0500  NA  138  141  K  4.2  3.8  CL  101  106  CO2  30  28  BUN  20  12  CREATININE  0.68  0.56  GLUCOSE  135*  107*   Electrolytes Recent Labs     03/28/13  1122  03/29/13  0500  CALCIUM  9.6  8.8  MG  2.4   --    Sepsis Markers No results found for this basename: LACTICACIDVEN, PROCALCITON, O2SATVEN,  in the last 72 hours ABG No results found for this basename: PHART, PCO2ART, PO2ART,  in the last 72 hours Liver Enzymes No results found for this basename: AST, ALT, ALKPHOS, BILITOT, ALBUMIN,  in the last 72 hours Cardiac Enzymes Recent Labs  03/28/13  1122  03/28/13  2320  03/29/13  0500  TROPONINI  <0.30  <0.30  <0.30   Glucose No results found for this basename: GLUCAP,  in the last 72 hours  Imaging Dg Chest 2 View  03/30/2013   *RADIOLOGY REPORT*  Clinical Data: Post pacemaker implantation  CHEST - 2 VIEW  Comparison: 03/23/2013; 01/09/2012; chest CT - 03/31/2012  Findings:  Grossly unchanged cardiac silhouette and mediastinal contours. Interval placement of left anterior chest wall dual lead pacemaker with lead tips overlying the expected location of the right atrium and ventricle.  Interval development of a small to moderate-sized left-sided pneumothorax.  Mild cephalization of flow without frank evidence of edema.  Trace left-sided pleural effusion.  Unchanged bones.  Post cholecystectomy.  IMPRESSION:  Interval development of a small to moderate-sized left-sided pneumothorax post left anterior chest wall pacemaker implantation.  Critical Value/emergent results  were called by telephone at the time of interpretation on 03/30/2013 at 07:39 to [place], [place], who verbally acknowledged these results.   Original Report Authenticated By: Tacey Ruiz, MD   Dg Chest Port 1 View  03/31/2013   CLINICAL DATA:  Check chest tube position  EXAM: PORTABLE CHEST - 1 VIEW  COMPARISON:  03/30/2013  FINDINGS: The left chest tube is again identified and stable in appearance. There remains a tiny apical pneumothorax stable from the prior study. Small left-sided pleural effusion is seen. The right lung remains clear. The cardiac shadow in pacing device are stable. No bony abnormality is noted.  IMPRESSION: No significant interval change from the prior exam.   Electronically Signed   By: Alcide Clever M.D.   On: 03/31/2013 07:26   Dg Chest Port 1 View  03/30/2013   CLINICAL DATA:  Post left chest tube placement.  Left pneumothorax.  EXAM: PORTABLE CHEST - 1 VIEW  COMPARISON:  03/30/2013  FINDINGS: Pigtail type catheter was placed in the left chest. Catheter tip is at the left lung apex. Near-complete resolution of the left pneumothorax. There is a tiny amount of left apical pleural air remaining. Densities at the left lung base suggest atelectasis or consolidation. No evidence for mediastinal shift. Heart size is stable. Again noted is a dual lead cardiac pacemaker on the left side.  IMPRESSION: Placement of a left chest tube with near complete resolution of the left pneumothorax.  Left basilar lung densities as described.   Electronically Signed   By: Richarda Overlie M.D.   On: 03/30/2013 16:28   Dg Chest Port 1 View  03/30/2013   CLINICAL DATA:  Left pneumothorax followup  EXAM: PORTABLE CHEST - 1 VIEW  COMPARISON:  Multiple priors  FINDINGS: Left pneumothorax is unchanged to slightly increased from the prior examination. There is no mediastinal shift. Cardiac pacing device is unchanged. Atelectatic changes of the left lung are noted. Possible small amount of left pleural fluid. Mild right  basilar atelectasis. Cardiomediastinal silhouette is unchanged. No acute osseous abnormality.  IMPRESSION: 1.  Unchanged to slightly increased left pneumothorax.  2. Left lung atelectatic changes. Possible small left pleural effusion.  2.  Mild right basilar atelectasis.  These results will be called to the ordering clinician or representative by the Radiologist Assistant, and communication documented in the PACS Dashboard.   Electronically Signed   By: Jerene Dilling M.D.   On: 03/30/2013 14:39   Dg Chest Port 1 View  03/30/2013   ADDENDUM REPORT: 03/30/2013 09:00  ADDENDUM: These results will be called to the ordering clinician  or representative by the Radiologist Assistant, and communication documented in the PACS Dashboard.   Electronically Signed   By: Malachy Moan M.D.   On: 03/30/2013 09:00   03/30/2013   CLINICAL DATA:  Evaluate pneumothorax  EXAM: PORTABLE CHEST - 1 VIEW  COMPARISON:  Prior chest x-ray earlier today at 6:06 a.m.  FINDINGS: Slight interval enlargement of left pneumothorax which is now 20-25%. Stable positioning of left subclavian approach cardiac rhythm maintenance device. Leads project over the right atrium and right ventricular apex. Stable cardiac and mediastinal contours. Atherosclerotic calcification noted in the transverse aorta. Mild bibasilar atelectasis. No acute osseous abnormality.  IMPRESSION: Similar to slightly enlarged 20- 25% left pneumothorax.  Electronically Signed: By: Malachy Moan M.D. On: 03/30/2013 08:56    ASSESSMENT / PLAN:  PULMONARY A: Traumatic Pneumothorax s/p wayne chest tube - small apical pneumo remains P:   Begin to wean off [place] O2 F/u CXR in am Chest tube with 10 mL of output overnight continue with suction  CARDIOVASCULAR A:  SSS post pacer 10-20  P:  Per cards  RENAL A:   No acute process P:   F/u BMET as needed KVO fluids as patient is eating Monitor renal fx  GASTROINTESTINAL A:  Nutrition P:   Heart  diet  HEMATOLOGIC A:   No acute process P:  F/u CBC as needed  INFECTIOUS A:   Post PPM prophylaxis P:   Ancef per cards  ENDOCRINE A:   No acute process   P:   Monitor blood sugar on BMET  NEUROLOGIC A:   Dementia/Lethargy P:   Monitor mental status  PCCM consulting for traumatic pneumothorax after placement of PPM.  Wayne catheter placed yesterday. 10 mL of fluid output noted.  Pneumothorax noted to be significantly reduced today on CXR, but apical pneumothorax remains on CXR.  Will repeat CXR in the am.  Chest tube to water seal with repeat CXR in 2 hours and in AM if no change in PTX.  Patient oxygenating well.  No mention of patient on oxygen at baseline so we will start to wean O2 on [place] to off.    Carley Hammed Physician Assistant Student 03/31/2013, 9:25 AM  Change CT to water seal with repeat CXR in 2 hours and in a AM.  If improved then will d/c in AM.  Patient seen and examined, agree with above note.  I dictated the care and orders written for this patient under my direction.  Alyson Reedy, MD [phone]

## 2013-03-31 NOTE — Progress Notes (Signed)
Pt transferred from ICU to stepdown. Family present at bedside and called, so aware of transfer. Pt is only oriented to person, but has no questions at this time. All belongings present with pt upon transfer. E-link and centralized tele notified.   Delynn Flavin, RN

## 2013-03-31 NOTE — Progress Notes (Signed)
SUBJECTIVE: The patient is sleeping but easily arouses and is without complaint this morning.  At this time, she denies chest pain, shortness of breath, or any new concerns.  S/p chest tube placement yesterday for PTX - now improved.    CURRENT MEDICATIONS: . bisacodyl  5 mg Oral QHS  . Chlorhexidine Gluconate Cloth  6 each Topical Q0600  . mirtazapine  15 mg Oral QHS  . mupirocin ointment  1 application Nasal BID  . simvastatin  20 mg Oral QHS  . sodium chloride  3 mL Intravenous Q12H  . venlafaxine XR  150 mg Oral Daily      OBJECTIVE: Physical Exam: Filed Vitals:   03/31/13 0400 03/31/13 0433 03/31/13 0500 03/31/13 0600  BP: 190/76 169/71 137/57 135/51  Pulse: 92 87 105 94  Temp: 97.9 F (36.6 C)     TempSrc: Oral     Resp: 16 13 16 18   Height:      Weight:      SpO2: 97% 98% 97% 98%    Intake/Output Summary (Last 24 hours) at 03/31/13 4540 Last data filed at 03/31/13 0000  Gross per 24 hour  Intake    180 ml  Output      0 ml  Net    180 ml    Telemetry reveals sinus rhythm  GEN- The patient is elderly appearing, sleeping but rouses Head- normocephalic, atraumatic Eyes-  Sclera clear, conjunctiva pink Ears- hearing intact Oropharynx- clear Neck- supple  Lungs- L sided BS improved, normal work of breathing Heart- Regular rate and rhythm,  GI- soft, NT, ND, + BS Extremities- no clubbing, cyanosis, or edema    LABS: Basic Metabolic Panel:  Recent Labs  98/11/91 1122 03/29/13 0500  NA 138 141  K 4.2 3.8  CL 101 106  CO2 30 28  GLUCOSE 135* 107*  BUN 20 12  CREATININE 0.68 0.56  CALCIUM 9.6 8.8  MG 2.4  --    CBC:  Recent Labs  03/28/13 1122 03/29/13 0500  WBC 8.7 9.7  NEUTROABS 6.9  --   HGB 13.9 12.3  HCT 41.1 36.7  MCV 85.6 85.2  PLT 259 240   Cardiac Enzymes:  Recent Labs  03/28/13 1122 03/28/13 2320 03/29/13 0500  TROPONINI <0.30 <0.30 <0.30   Thyroid Function Tests:  Recent Labs  03/28/13 1122  TSH 0.597     RADIOLOGY: Dg Chest 2 View 03/30/2013   *RADIOLOGY REPORT*  Clinical Data: Post pacemaker implantation  CHEST - 2 VIEW  Comparison: 03/23/2013; 01/09/2012; chest CT - 03/31/2012  Findings:  Grossly unchanged cardiac silhouette and mediastinal contours. Interval placement of left anterior chest wall dual lead pacemaker with lead tips overlying the expected location of the right atrium and ventricle.  Interval development of a small to moderate-sized left-sided pneumothorax.  Mild cephalization of flow without frank evidence of edema.  Trace left-sided pleural effusion.  Unchanged bones.  Post cholecystectomy.  IMPRESSION:  Interval development of a small to moderate-sized left-sided pneumothorax post left anterior chest wall pacemaker implantation.  Critical Value/emergent results were called by telephone at the time of interpretation on 03/30/2013 at 07:39 to [place], [place], who verbally acknowledged these results.   Original Report Authenticated By: Tacey Ruiz, MD   Dg Chest Port 1 View 03/30/2013   CLINICAL DATA:  Post left chest tube placement.  Left pneumothorax.  EXAM: PORTABLE CHEST - 1 VIEW  COMPARISON:  03/30/2013  FINDINGS: Pigtail type catheter was placed in the left chest.  Catheter tip is at the left lung apex. Near-complete resolution of the left pneumothorax. There is a tiny amount of left apical pleural air remaining. Densities at the left lung base suggest atelectasis or consolidation. No evidence for mediastinal shift. Heart size is stable. Again noted is a dual lead cardiac pacemaker on the left side.  IMPRESSION: Placement of a left chest tube with near complete resolution of the left pneumothorax.  Left basilar lung densities as described.   Electronically Signed   By: Richarda Overlie M.D.   On: 03/30/2013 16:28   ASSESSMENT AND PLAN:  Principal Problem:   Pneumothorax, traumatic Active Problems:   Bradycardia   Syncope   Dementia with behavioral disturbance   History of TIA (transient  ischemic attack)   SSS (sick sinus syndrome)  1. SSS Doing well s/p PPM  2. Pneumothorax Lung aeration improved with chest tube Management per Pulm  3. Confusion per primary team  4. HTN Better this am,  Likely worsened by pain  Continue current management

## 2013-04-01 ENCOUNTER — Inpatient Hospital Stay (HOSPITAL_COMMUNITY): Payer: Medicare Other

## 2013-04-01 DIAGNOSIS — R55 Syncope and collapse: Secondary | ICD-10-CM

## 2013-04-01 DIAGNOSIS — J95811 Postprocedural pneumothorax: Secondary | ICD-10-CM

## 2013-04-01 DIAGNOSIS — F0391 Unspecified dementia with behavioral disturbance: Secondary | ICD-10-CM

## 2013-04-01 DIAGNOSIS — I10 Essential (primary) hypertension: Secondary | ICD-10-CM

## 2013-04-01 DIAGNOSIS — I495 Sick sinus syndrome: Secondary | ICD-10-CM

## 2013-04-01 DIAGNOSIS — I498 Other specified cardiac arrhythmias: Secondary | ICD-10-CM

## 2013-04-01 DIAGNOSIS — Z8673 Personal history of transient ischemic attack (TIA), and cerebral infarction without residual deficits: Secondary | ICD-10-CM

## 2013-04-01 LAB — BASIC METABOLIC PANEL
CO2: 26 mEq/L (ref 19–32)
Calcium: 8.9 mg/dL (ref 8.4–10.5)
Chloride: 97 mEq/L (ref 96–112)
Creatinine, Ser: 0.49 mg/dL — ABNORMAL LOW (ref 0.50–1.10)
Glucose, Bld: 105 mg/dL — ABNORMAL HIGH (ref 70–99)
Sodium: 134 mEq/L — ABNORMAL LOW (ref 135–145)

## 2013-04-01 LAB — CBC
Hemoglobin: 12.9 g/dL (ref 12.0–15.0)
MCH: 28.7 pg (ref 26.0–34.0)
MCV: 84.7 fL (ref 78.0–100.0)
RBC: 4.5 MIL/uL (ref 3.87–5.11)

## 2013-04-01 LAB — PHOSPHORUS: Phosphorus: 3 mg/dL (ref 2.3–4.6)

## 2013-04-01 NOTE — Clinical Social Work Psychosocial (Addendum)
Clinical Social Work Department BRIEF PSYCHOSOCIAL ASSESSMENT 04/01/2013  Patient:  Amy Wilkinson, Amy Wilkinson     Account Number:  [id number]     Admit date:  03/28/2013  Clinical Social Worker:  Varney Biles  Date/Time:  04/01/2013 11:04 AM  Referred by:  Physician  Date Referred:  04/01/2013 Referred for  ALF Placement   Other Referral:   Interview type:  Patient Other interview type:    PSYCHOSOCIAL DATA Living Status:  FACILITY Admitted from facility:  [facility] Level of care:  Assisted Living Primary support name:  [name] Primary support relationship to patient:  CHILD, ADULT Degree of support available:   Good--pt has 2 sons (1 named [name]) and 2 daughters [name] and [place]). Pt says one daughter lives close by in [place]. One of pt's sons (in [place]) sent her flowers.    CURRENT CONCERNS Current Concerns  Post-Acute Placement   Other Concerns:    SOCIAL WORK ASSESSMENT / PLAN Pt says she has been living in an apartment at [facility] for a few years. Pt says she has some friends at her ALF, and that one daughter lives close by and provides support. CSW has called [employer] and verified that pt can return when medically ready. Pt engaged in life review, explaining she was born in raised in [place] and that she moved to [facility] after completing school. CSW provided active listening and support. CSW explained her role in discharge process, and pt is understanding of CSW role in care.   Assessment/plan status:  Psychosocial Support/Ongoing Assessment of Needs Other assessment/ plan:   Information/referral to community resources:   ALF [facility] Amanda Cockayne).    PATIENT'S/FAMILY'S RESPONSE TO PLAN OF CARE: Pt receptive to CSW visit and answered all CSW questions.       Maryclare Labrador, MSW, [facility] Clinical Social Worker [phone]

## 2013-04-01 NOTE — Progress Notes (Signed)
Patient ID: Amy Wilkinson, female   DOB: [birth year], 79 y.o.   MRN: [id number]   SUBJECTIVE: S/p chest tube for PTX, now removed. She denies c/p or sob. Sore at PPM incision site.  CURRENT MEDICATIONS: . bisacodyl  5 mg Oral QHS  . Chlorhexidine Gluconate Cloth  6 each Topical Q0600  . mirtazapine  15 mg Oral QHS  . mupirocin ointment  1 application Nasal BID  . simvastatin  20 mg Oral QHS  . sodium chloride  3 mL Intravenous Q12H  . venlafaxine XR  150 mg Oral Daily      OBJECTIVE: Physical Exam: Filed Vitals:   04/01/13 0346 04/01/13 0400 04/01/13 0619 04/01/13 0804  BP:  113/89  145/84  Pulse:    83  Temp: 97.8 F (36.6 C)   98.7 F (37.1 C)  TempSrc: Oral   Oral  Resp: 17 18 19 16   Height:      Weight:   141 lb 8.6 oz (64.2 kg)   SpO2: 93%   94%    Intake/Output Summary (Last 24 hours) at 04/01/13 1024 Last data filed at 03/31/13 2200  Gross per 24 hour  Intake    580 ml  Output    400 ml  Net    180 ml    Telemetry reveals sinus rhythm  GEN- The patient is elderly appearing, sleeping but rouses Head- normocephalic, atraumatic Eyes-  Sclera clear, conjunctiva pink Ears- hearing intact Oropharynx- clear Neck- supple  Lungs- L sided BS improved, normal work of breathing Heart- Regular rate and rhythm,  GI- soft, NT, ND, + BS Extremities- no clubbing, cyanosis, or edema    LABS: Basic Metabolic Panel:  Recent Labs  40/98/11 0545  NA 134*  K 3.7  CL 97  CO2 26  GLUCOSE 105*  BUN 11  CREATININE 0.49*  CALCIUM 8.9  MG 2.1  PHOS 3.0   CBC:  Recent Labs  04/01/13 0545  WBC 8.5  HGB 12.9  HCT 38.1  MCV 84.7  PLT 322   Cardiac Enzymes: No results found for this basename: CKTOTAL, CKMB, CKMBINDEX, TROPONINI,  in the last 72 hours Thyroid Function Tests: No results found for this basename: TSH, T4TOTAL, FREET3, T3FREE, THYROIDAB,  in the last 72 hours  RADIOLOGY: Dg Chest 2 View 03/30/2013   *RADIOLOGY REPORT*  Clinical Data: Post  pacemaker implantation  CHEST - 2 VIEW  Comparison: 03/23/2013; 01/09/2012; chest CT - 03/31/2012  Findings:  Grossly unchanged cardiac silhouette and mediastinal contours. Interval placement of left anterior chest wall dual lead pacemaker with lead tips overlying the expected location of the right atrium and ventricle.  Interval development of a small to moderate-sized left-sided pneumothorax.  Mild cephalization of flow without frank evidence of edema.  Trace left-sided pleural effusion.  Unchanged bones.  Post cholecystectomy.  IMPRESSION:  Interval development of a small to moderate-sized left-sided pneumothorax post left anterior chest wall pacemaker implantation.  Critical Value/emergent results were called by telephone at the time of interpretation on 03/30/2013 at 07:39 to [place], [place], who verbally acknowledged these results.   Original Report Authenticated By: Tacey Ruiz, MD   Dg Chest Port 1 View 03/30/2013   CLINICAL DATA:  Post left chest tube placement.  Left pneumothorax.  EXAM: PORTABLE CHEST - 1 VIEW  COMPARISON:  03/30/2013  FINDINGS: Pigtail type catheter was placed in the left chest. Catheter tip is at the left lung apex. Near-complete resolution of the left pneumothorax. There is a tiny amount  of left apical pleural air remaining. Densities at the left lung base suggest atelectasis or consolidation. No evidence for mediastinal shift. Heart size is stable. Again noted is a dual lead cardiac pacemaker on the left side.  IMPRESSION: Placement of a left chest tube with near complete resolution of the left pneumothorax.  Left basilar lung densities as described.   Electronically Signed   By: Richarda Overlie M.D.   On: 03/30/2013 16:28   ASSESSMENT AND PLAN:  Principal Problem:   Pneumothorax, traumatic Active Problems:   Bradycardia   Syncope   Dementia with behavioral disturbance   History of TIA (transient ischemic attack)   SSS (sick sinus syndrome)  1. SSS Doing well s/p PPM  2.  Pneumothorax Lung aeration improved with chest tube, now removed Management per Pulm  3. Confusion appears back to baseline  4. HTN Better this am,  Likely worsened by pain  Continue current management, will ambulate and consider discharge tomorrow.  Leonia Reeves.D.

## 2013-04-01 NOTE — Consult Note (Signed)
PULMONARY  / CRITICAL CARE MEDICINE  Name: Amy Wilkinson MRN: [id number] DOB: [birth year]    ADMISSION DATE:  03/28/2013 CONSULTATION DATE: 10-20  REFERRING MD :  Alred PRIMARY SERVICE: [facility]  CHIEF COMPLAINT:  Hypoxia  BRIEF PATIENT DESCRIPTION: 79 yo demented WF with SSS who underwent PPM left on 10-20. She developed hypoxia am of 10-21 and required increasing fio2. CxR revealed moderate left pnx and PCCM asked to evaluate. Tx to ICU and place wayne pnx chest tube.   SIGNIFICANT EVENTS / STUDIES:  10-20 left PPM 10-21 left moderate pnx 10/21 insertion of left wayne chest tube  LINES / TUBES: PIV 10/21 L chest tube>>10/23  CULTURES: None  ANTIBIOTICS: 10-20 ancef>>10/22  HISTORY OF PRESENT ILLNESS:   79 yo demented WF with SSS who underwent PPM left on 10-20. She developed hypoxia am of 10-21 and required increasing fio2. CxR revealed moderate left pnx and PCCM asked to evaluate. Tx to ICU and place wayne pnx chest tube.  SUBJECTIVE: Patient resting comfortably.  No complaints this morning.    VITAL SIGNS: Temp:  [97.5 F (36.4 C)-99.2 F (37.3 C)] 98.7 F (37.1 C) (10/23 0804) Pulse Rate:  [83-106] 83 (10/23 0804) Resp:  [14-26] 16 (10/23 0804) BP: (113-159)/(45-132) 145/84 mmHg (10/23 0804) SpO2:  [92 %-96 %] 94 % (10/23 0804) Weight:  [141 lb 8.6 oz (64.2 kg)] 141 lb 8.6 oz (64.2 kg) (10/23 0619) HEMODYNAMICS:   VENTILATOR SETTINGS:   INTAKE / OUTPUT: Intake/Output     10/22 0701 - 10/23 0700 10/23 0701 - 10/24 0700   P.O. 580    Total Intake(mL/kg) 580 (9)    Urine (mL/kg/hr) 400 (0.3)    Total Output 400     Net +180          Urine Occurrence 4 x    Stool Occurrence       PHYSICAL EXAMINATION: General:  Alert and awake.   Neuro:  Alert and pleasant.  Speech easily understood. Moves all extremities x4 HEENT: NO JVD/LAN, PERLA, MM pink and moist Cardiovascular:  HSR Lungs:  CTA good breath sounds bilaterally Abdomen: +bs, soft and  nontender Musculoskeletal:  intact Skin:  warm  LABS:  CBC Recent Labs     04/01/13  0545  WBC  8.5  HGB  12.9  HCT  38.1  PLT  322   Coag's No results found for this basename: APTT, INR,  in the last 72 hours BMET Recent Labs     04/01/13  0545  NA  134*  K  3.7  CL  97  CO2  26  BUN  11  CREATININE  0.49*  GLUCOSE  105*   Electrolytes Recent Labs     04/01/13  0545  CALCIUM  8.9  MG  2.1  PHOS  3.0   Sepsis Markers No results found for this basename: LACTICACIDVEN, PROCALCITON, O2SATVEN,  in the last 72 hours ABG No results found for this basename: PHART, PCO2ART, PO2ART,  in the last 72 hours Liver Enzymes No results found for this basename: AST, ALT, ALKPHOS, BILITOT, ALBUMIN,  in the last 72 hours Cardiac Enzymes No results found for this basename: TROPONINI, PROBNP,  in the last 72 hours Glucose No results found for this basename: GLUCAP,  in the last 72 hours  Imaging Dg Chest Port 1 View  04/01/2013   CLINICAL DATA:  Pneumothorax and chest tube.  EXAM: PORTABLE CHEST - 1 VIEW  COMPARISON:  03/31/2013  FINDINGS: The left chest  tube is in a stable position near the apex. No clear evidence for a left pneumothorax. Slightly increased left basilar densities. Findings most likely are associated with atelectasis. Heart and mediastinum are stable. Stable appearance of the dual lead cardiac pacemaker.  IMPRESSION: Stable position of the left chest tube without a definite pneumothorax.  Left basilar densities are probably associated with volume loss.   Electronically Signed   By: Richarda Overlie M.D.   On: 04/01/2013 07:27   Dg Chest Port 1 View  03/31/2013   CLINICAL DATA:  Chest tube  EXAM: PORTABLE CHEST - 1 VIEW  COMPARISON:  03/31/2013 at 0538 hours  FINDINGS: Stable left apical chest tube. Tiny left apical pneumothorax, possibly minimally decreased. Suspected small left pleural effusion component.  The heart is normal in size. Left subclavian pacemaker.   IMPRESSION: Stable left apical chest tube.  Tiny left apical pneumothorax, possibly minimally decreased.   Electronically Signed   By: Charline Bills M.D.   On: 03/31/2013 13:28   Dg Chest Port 1 View  03/31/2013   CLINICAL DATA:  Check chest tube position  EXAM: PORTABLE CHEST - 1 VIEW  COMPARISON:  03/30/2013  FINDINGS: The left chest tube is again identified and stable in appearance. There remains a tiny apical pneumothorax stable from the prior study. Small left-sided pleural effusion is seen. The right lung remains clear. The cardiac shadow in pacing device are stable. No bony abnormality is noted.  IMPRESSION: No significant interval change from the prior exam.   Electronically Signed   By: Alcide Clever M.D.   On: 03/31/2013 07:26   Dg Chest Port 1 View  03/30/2013   CLINICAL DATA:  Post left chest tube placement.  Left pneumothorax.  EXAM: PORTABLE CHEST - 1 VIEW  COMPARISON:  03/30/2013  FINDINGS: Pigtail type catheter was placed in the left chest. Catheter tip is at the left lung apex. Near-complete resolution of the left pneumothorax. There is a tiny amount of left apical pleural air remaining. Densities at the left lung base suggest atelectasis or consolidation. No evidence for mediastinal shift. Heart size is stable. Again noted is a dual lead cardiac pacemaker on the left side.  IMPRESSION: Placement of a left chest tube with near complete resolution of the left pneumothorax.  Left basilar lung densities as described.   Electronically Signed   By: Richarda Overlie M.D.   On: 03/30/2013 16:28   Dg Chest Port 1 View  03/30/2013   CLINICAL DATA:  Left pneumothorax followup  EXAM: PORTABLE CHEST - 1 VIEW  COMPARISON:  Multiple priors  FINDINGS: Left pneumothorax is unchanged to slightly increased from the prior examination. There is no mediastinal shift. Cardiac pacing device is unchanged. Atelectatic changes of the left lung are noted. Possible small amount of left pleural fluid. Mild right  basilar atelectasis. Cardiomediastinal silhouette is unchanged. No acute osseous abnormality.  IMPRESSION: 1.  Unchanged to slightly increased left pneumothorax.  2. Left lung atelectatic changes. Possible small left pleural effusion.  2.  Mild right basilar atelectasis.  These results will be called to the ordering clinician or representative by the Radiologist Assistant, and communication documented in the PACS Dashboard.   Electronically Signed   By: Jerene Dilling M.D.   On: 03/30/2013 14:39    ASSESSMENT / PLAN:  PULMONARY A: Traumatic Pneumothorax s/p wayne chest tube - small apical pneumo remains P:   O2 sats >92% No air leak on water seal Chest tube removed this am F/u CXR  CARDIOVASCULAR A:  SSS post pacer 10-20  P:  Per cards  RENAL A:   No acute process P:   F/u BMET as needed KVO fluids as patient is eating Monitor renal fx  GASTROINTESTINAL A:  Nutrition P:   Heart diet  HEMATOLOGIC A:   No acute process P:  F/u CBC as needed  INFECTIOUS A:   Post PPM prophylaxis - complete P:   - 3 doses ancef given  ENDOCRINE A:   No acute process   P:   Monitor blood sugar on BMET  NEUROLOGIC A:   Dementia/Lethargy P:   Monitor mental status  PCCM consulting for traumatic pneumothorax after placement of PPM.  Wayne catheter removed this morning.  Patient currently stable and in good condition critical care will sign off.  Will transfer her to the care of [facility].  Thank you for the opportunity to consult.  Please call if further assistance is required.     Carley Hammed Physician Assistant Student 04/01/2013, 10:08 AM  Remove chest tube.  CXR ordered for 2PM and if no further PTX noted then patient is cleared for d/c from a pulmonary standpoint.  Patient seen and examined, agree with above note.  I dictated the care and orders written for this patient under my direction.  Alyson Reedy, MD [phone]

## 2013-04-01 NOTE — Progress Notes (Signed)
[facility] Progress Note [place] TEAM 1 - Stepdown ICU Team   Amy Wilkinson ZOX:[id number] DOB: [birth year] DOA: 03/28/2013 PCP: Allean Found, MD  Brief narrative: 79 y.o. patient was brought to the hospital secondary to syncopal episode.As such we were initially consulted for further evaluation or recommendations. But as time passed patient was noted to have recurrent symptomatic bradycardia with pauses (approximately 10 seconds) associated with unresponsiveness. Cardiology was asked to evaluate the patient and pt was subsequently transferred to CCU. She underwent pacemaker implantation for symptomatic sinus mode dysfunction on 10/20. Post procedure she was noted to have hypoxia and increasing O2 requirements. Subsequent CXR revealed a pneumonthorax. She was transferred to ICU and PCCM assumed care.   Assessment/Plan: Active Problems:   Bradycardia/SSS (sick sinus syndrome) -resolved after PPM -per EP/Cards    Syncope -due to symptomatic sinus node dysfunction    Pneumothorax, postprocedural -pigtail CT removed by [facility] 10/23 -FU CXR pending    Dementia with behavioral disturbance -resident ALF per admit -PT/OT evaluation to ensure appropriate to return    History of TIA (transient ischemic attack)   DVT prophylaxis: SCDs Code Status: Full Family Communication: Patient only Disposition Plan/Expected LOS: Transfer to telemetry Isolation: Contact isolation for MRSA PCR positive status Nutritional Status: Stable  Consultants: Cardiology PCCM  Procedures: Pacemaker implantation  10/20  Antibiotics: Cefazolin 10/20 x 2 doses  HPI/Subjective: Patient alert and without any complaints. She is somewhat sore over pacemaker implantation site. No shortness of breath, dizziness, or chest pain  Objective: Blood pressure 114/58, pulse 83, temperature 98.4 F (36.9 C), temperature source Oral, resp. rate 16, height 5\' 3"  (1.6 m), weight 64.2 kg (141 lb 8.6  oz), SpO2 95.00%.  Intake/Output Summary (Last 24 hours) at 04/01/13 1343 Last data filed at 03/31/13 2200  Gross per 24 hour  Intake    580 ml  Output    400 ml  Net    180 ml     Exam: General: No acute respiratory distress Lungs: Clear to auscultation bilaterally without wheezes or crackles, RA Cardiovascular: Regular rate and rhythm without murmur gallop or rub normal S1 and S2, no peripheral edema or JVD-left anterior chest with dressing clean dry and intact Abdomen: Nontender, nondistended, soft, bowel sounds positive, no rebound, no ascites, no appreciable mass Musculoskeletal: No significant cyanosis, clubbing of bilateral lower extremities-left arm in sling Neurological: Alert and oriented x 3, moves all extremities x 4 without focal neurological deficits, CN 2-12 intact  Scheduled Meds:  Scheduled Meds: . bisacodyl  5 mg Oral QHS  . Chlorhexidine Gluconate Cloth  6 each Topical Q0600  . mirtazapine  15 mg Oral QHS  . mupirocin ointment  1 application Nasal BID  . simvastatin  20 mg Oral QHS  . sodium chloride  3 mL Intravenous Q12H  . venlafaxine XR  150 mg Oral Daily   Continuous Infusions:   **Reviewed in detail by the Attending Physician  Data Reviewed: Basic Metabolic Panel:  Recent Labs Lab 03/28/13 1122 03/29/13 0500 04/01/13 0545  NA 138 141 134*  K 4.2 3.8 3.7  CL 101 106 97  CO2 30 28 26   GLUCOSE 135* 107* 105*  BUN 20 12 11   CREATININE 0.68 0.56 0.49*  CALCIUM 9.6 8.8 8.9  MG 2.4  --  2.1  PHOS  --   --  3.0   Liver Function Tests: No results found for this basename: AST, ALT, ALKPHOS, BILITOT, PROT, ALBUMIN,  in the last 168 hours No results  found for this basename: LIPASE, AMYLASE,  in the last 168 hours No results found for this basename: AMMONIA,  in the last 168 hours CBC:  Recent Labs Lab 03/28/13 1122 03/29/13 0500 04/01/13 0545  WBC 8.7 9.7 8.5  NEUTROABS 6.9  --   --   HGB 13.9 12.3 12.9  HCT 41.1 36.7 38.1  MCV 85.6 85.2  84.7  PLT 259 240 322   Cardiac Enzymes:  Recent Labs Lab 03/28/13 1122 03/28/13 2320 03/29/13 0500  TROPONINI <0.30 <0.30 <0.30   BNP (last 3 results) No results found for this basename: PROBNP,  in the last 8760 hours CBG: No results found for this basename: GLUCAP,  in the last 168 hours  Recent Results (from the past 240 hour(s))  MRSA PCR SCREENING     Status: Abnormal   Collection Time    03/28/13  3:59 PM      Result Value Range Status   MRSA by PCR POSITIVE (*) NEGATIVE Final   Comment:            The GeneXpert MRSA Assay (FDA     approved for NASAL specimens     only), is one component of a     comprehensive MRSA colonization     surveillance program. It is not     intended to diagnose MRSA     infection nor to guide or     monitor treatment for     MRSA infections.     RESULT CALLED TO, READ BACK BY AND VERIFIED WITH:     S.[facility] 1804 03/28/13 M.Emond     Studies:  Recent x-ray studies have been reviewed in detail by the Attending Physician     Junious Silk, ANP [facility] Office  [phone] Pager [phone]  **If unable to reach the above provider after paging please contact the Flow Manager @ [phone]  On-Call/Text Page:      [email or web address]      password [email or web address]  If 7PM-7AM, please contact night-coverage [email or web address] Password [email or web address] 04/01/2013, 1:43 PM   LOS: 4 days   I have examined the patient, reviewed the chart and modified the above note which I agree with.   Kamdyn Covel,MD [phone] 04/01/2013, 5:42 PM

## 2013-04-02 ENCOUNTER — Inpatient Hospital Stay (HOSPITAL_COMMUNITY): Payer: Medicare Other

## 2013-04-02 DIAGNOSIS — J95811 Postprocedural pneumothorax: Secondary | ICD-10-CM

## 2013-04-02 NOTE — Progress Notes (Signed)
    Patient: Amy Wilkinson Date of Encounter: 04/02/2013, 7:14 AM Admit date: 03/28/2013     Subjective  Ms. Leija is resting comfortably and will not respond to my questions. She is pretending to be asleep. She would not respond to RN. RN reports she was "quite grouchy" this AM and stated she "just wanted to be left alone." She has not complained of CP, SOB or palpitations. She has been hemodynamically stable, in no distress.   Objective  Physical Exam: (limited as patient would not acknowledge my presence) Vitals: BP 135/61  Pulse 83  Temp(Src) 98.7 F (37.1 C) (Oral)  Resp 23  Ht 5\' 3"  (1.6 m)  Wt 141 lb 8.6 oz (64.2 kg)  BMI 25.08 kg/m2  SpO2 94% General: Well developed, well appearing 79 year old female in no acute distress.  Neck: Supple. JVD not elevated. Lungs: clear bilaterally with no wheezes Heart: RRR with soft systolic murmur at the base. Abdomen: Appears soft, non-distended. Extremities:  No edema Neuro: withdraws to pain. Skin: Unable to assess.  Intake/Output:  Intake/Output Summary (Last 24 hours) at 04/02/13 0714 Last data filed at 04/01/13 2212  Gross per 24 hour  Intake     63 ml  Output    600 ml  Net   -537 ml    Inpatient Medications:  . bisacodyl  5 mg Oral QHS  . mirtazapine  15 mg Oral QHS  . mupirocin ointment  1 application Nasal BID  . simvastatin  20 mg Oral QHS  . sodium chloride  3 mL Intravenous Q12H  . venlafaxine XR  150 mg Oral Daily    Labs:  Recent Labs  04/01/13 0545  NA 134*  K 3.7  CL 97  CO2 26  GLUCOSE 105*  BUN 11  CREATININE 0.49*  CALCIUM 8.9  MG 2.1  PHOS 3.0    Recent Labs  04/01/13 0545  WBC 8.5  HGB 12.9  HCT 38.1  MCV 84.7  PLT 322    Radiology/Studies: Dg Chest Port 1 View 04/01/2013    IMPRESSION: Recurrent small left apical pneumothorax (less than 5%) following removal of the left pigtail thoracostomy tube.  Improving left basilar atelectasis.  These results will be called to the  ordering clinician or representative by the Radiologist Assistant, and communication documented in the PACS Dashboard.    Electronically Signed   By: Malachy Moan M.D.   On: 04/01/2013 16:18   Dg Chest Port 1 View 04/01/2013     IMPRESSION: Stable position of the left chest tube without a definite pneumothorax.  Left basilar densities are probably associated with volume loss.    Electronically Signed   By: Richarda Overlie M.D.   On: 04/01/2013 07:27   Telemetry: SR   Assessment and Plan  1. Sinus node dysfunction s/p PPM implant 2. Pneumothorax  - Lung aeration improved with chest tube, now removed - Small, recurrent left apical ptx (<5%) following removal of chest tube  - Management per Pulm  3. Confusion 4. HTN  - Stable - Continue current management  Dr. Ladona Ridgel to see Signed, Exie Parody  EP Attending  Patient seen and examined. Agree with above with amendments. We will sign off. PPM is stable as is PTX. We will arrange for followup.   Amy Wilkinson.D.

## 2013-04-02 NOTE — Evaluation (Signed)
Occupational Therapy Evaluation Patient Details Name: Amy Wilkinson MRN: [id number] DOB: April 11, 1922 Today's Date: 04/02/2013 Time: 1222-1257 OT Time Calculation (min): 35 min  OT Assessment / Plan / Recommendation History of present illness 79 y.o. patient was brought to the hospital secondary to syncopal episode. patient was noted to have recurrent symptomatic bradycardia with pauses (approximately 10 seconds) associated with unresponsiveness. Cardiology was asked to evaluate the patient . She underwent pacemaker implantation for symptomatic sinus mode dysfunction on 10/20. Post procedure she was noted to have hypoxia and increasing O2 requirements. Subsequent CXR revealed a pneumonthorax had CT placed which was removed 10/23   Clinical Impression   Pt admitted with above.  She demonstrates the below listed deficits and will benefit from continued OT to maximize safety and independence with BADLs.  Currently, she requires max A for BADLs due to pacer precautions and limited Lt. UE use, and is mod A for functional mobility.  She lives at [facility].  Unsure if they can provide current level of assist required, or if she may need SNF.  Anticipate her ability to perform self care activities will improve by mid next week once pacemaker precautions loosened, but she will need assist when she is up to prevent falls.     OT Assessment  Patient needs continued OT Services    Follow Up Recommendations  SNF;Supervision/Assistance - 24 hour;Home health OT (depending if ALF able to accommodat pt care needs)    Barriers to Discharge   Pt resided at ALF.  Currently, she requires mod A for functional mobility and BADLs.  Unsure if facility able to provide this level of assist  Equipment Recommendations  3 in 1 bedside comode    Recommendations for Other Services    Frequency  Min 2X/week    Precautions / Restrictions Precautions Precautions: ICD/Pacemaker;Fall Restrictions LUE Weight  Bearing: Non weight bearing   Pertinent Vitals/Pain     ADL  Eating/Feeding: Set up Where Assessed - Eating/Feeding: Chair Grooming: Wash/dry face;Brushing hair;Minimal assistance Where Assessed - Grooming: Supported sitting Upper Body Bathing: Moderate assistance Where Assessed - Upper Body Bathing: Supported sitting Lower Body Bathing: Maximal assistance Where Assessed - Lower Body Bathing: Supported sit to stand Upper Body Dressing: Maximal assistance Where Assessed - Upper Body Dressing: Unsupported sitting Lower Body Dressing: +1 Total assistance Where Assessed - Lower Body Dressing: Supported sit to Pharmacist, hospital: Moderate assistance Toilet Transfer Method: Sit to stand;Stand pivot Toilet Transfer Equipment: Bedside commode Toileting - Clothing Manipulation and Hygiene: Moderate assistance Where Assessed - Toileting Clothing Manipulation and Hygiene: Standing Equipment Used: Other (comment) (sling Lt. UE) Transfers/Ambulation Related to ADLs: Pt ambulates HHA with mod A ADL Comments: Pt requires assist for ADLs due to pacemaker precautions and limited use of Lt. UE coupled with dementia and pt difficulty adhering to precautions     OT Diagnosis: Generalized weakness;Cognitive deficits  OT Problem List: Decreased strength;Decreased activity tolerance;Impaired balance (sitting and/or standing);Decreased cognition;Decreased safety awareness;Decreased knowledge of use of DME or AE;Decreased knowledge of precautions;Impaired UE functional use OT Treatment Interventions: Self-care/ADL training;DME and/or AE instruction;Therapeutic activities;Patient/family education;Balance training   OT Goals(Current goals can be found in the care plan section) Acute Rehab OT Goals Patient Stated Goal: To get out of bed OT Goal Formulation: With patient Time For Goal Achievement: 04/16/13 Potential to Achieve Goals: Good ADL Goals Pt Will Perform Grooming: with min assist;standing Pt  Will Perform Upper Body Bathing: with min assist;sitting Pt Will Perform Lower Body Bathing: with min  assist;sit to/from stand Pt Will Perform Upper Body Dressing: with min assist;sitting Pt Will Perform Lower Body Dressing: with min assist;sit to/from stand Pt Will Transfer to Toilet: with mod assist;ambulating;bedside commode  Visit Information  Last OT Received On: 04/02/13 Assistance Needed: +1 History of Present Illness: 79 y.o. patient was brought to the hospital secondary to syncopal episode. patient was noted to have recurrent symptomatic bradycardia with pauses (approximately 10 seconds) associated with unresponsiveness. Cardiology was asked to evaluate the patient . She underwent pacemaker implantation for symptomatic sinus mode dysfunction on 10/20. Post procedure she was noted to have hypoxia and increasing O2 requirements. Subsequent CXR revealed a pneumonthorax had CT placed which was removed 10/23       Prior Functioning     Home Living Family/patient expects to be discharged to:: Assisted living Home Equipment: Dan Humphreys - 4 wheels Additional Comments: Pt resided at [facility] Prior Function Level of Independence: Needs assistance Gait / Transfers Assistance Needed: Per sister, pt ambulated with use of rollator ADL's / Homemaking Assistance Needed: Per sister's report, pt required assist with bathing and dressing.  Pt is poor historian Communication Communication: No difficulties Dominant Hand: Right         Vision/Perception     Cognition  Cognition Arousal/Alertness: Awake/alert Behavior During Therapy: WFL for tasks assessed/performed Overall Cognitive Status: History of cognitive impairments - at baseline    Extremity/Trunk Assessment Upper Extremity Assessment Upper Extremity Assessment: LUE deficits/detail LUE: Unable to fully assess due to immobilization (Pacer precautions) Lower Extremity Assessment Lower Extremity Assessment: Defer to PT  evaluation     Mobility Bed Mobility Bed Mobility: Supine to Sit;Sitting - Scoot to Edge of Bed Supine to Sit: 4: Min assist;HOB elevated Sitting - Scoot to [place] of Bed: 4: Min assist Details for Bed Mobility Assistance: Pt requires assist to lift shoulders from bed and assist to scoot hips to EOB Transfers Transfers: Sit to Stand;Stand to Sit Sit to Stand: 3: Mod assist;With upper extremity assist;From bed;From chair/3-in-1 Stand to Sit: 3: Mod assist;With upper extremity assist;To chair/3-in-1 Details for Transfer Assistance: Pt unsteady when standing, requiring assist for balance.  Cues and assist for hand placement     Exercise     Balance Balance Balance Assessed: Yes Static Standing Balance Static Standing - Balance Support: Right upper extremity supported Static Standing - Level of Assistance: 4: Min assist Static Standing - Comment/# of Minutes: 4   End of Session OT - End of Session Equipment Utilized During Treatment: Other (comment) (sling Lt. UE) Activity Tolerance: Patient tolerated treatment well Patient left: in chair;with call bell/phone within reach Nurse Communication: Mobility status  GO     Burwell Bethel, Ursula Alert M 04/02/2013, 1:32 PM

## 2013-04-02 NOTE — Clinical Social Work Note (Signed)
CSW visited pt's room and spoke with pt's sister, Diamond Nickel. Beatrice asked about pt's medical condition, and RN was able to provide information about pt's prognosis. Pt was sleeping when RN and CSW were in the room, and Diamond Nickel woke pt so RN could clear pt's nostrils. CSW explained her role to [place] concerning discharge back to [facility]. CSW offered to call Diamond Nickel when pt is ready to discharge back to facility--Beatrice would appreciate this. Her number is [phone].    Maryclare Labrador, MSW, [facility] Clinical Social Worker [phone]

## 2013-04-02 NOTE — Progress Notes (Signed)
[facility] Progress Note Amy Wilkinson TEAM 1 - Stepdown ICU Team   Amy Wilkinson WJX:[id number] DOB: 13-Apr-1922 DOA: 03/28/2013 PCP: Allean Found, MD  Brief narrative: 79 y.o. patient was brought to the hospital secondary to syncopal episode.As such we were initially consulted for further evaluation or recommendations. But as time passed patient was noted to have recurrent symptomatic bradycardia with pauses (approximately 10 seconds) associated with unresponsiveness. Cardiology was asked to evaluate the patient and pt was subsequently transferred to CCU. She underwent pacemaker implantation for symptomatic sinus mode dysfunction on 10/20. Post procedure she was noted to have hypoxia and increasing O2 requirements. Subsequent CXR revealed a pneumonthorax. She was transferred to ICU and PCCM assumed care.   Assessment/Plan: Active Problems:   Bradycardia/SSS (sick sinus syndrome) -resolved after PPM -per EP/Cards    Syncope -due to symptomatic sinus node dysfunction    Pneumothorax, postprocedural -pigtail CT removed by PCCM 10/23 -FU CXR residual 5% pntx- pulmonary endorses will resolve over time    Dementia with behavioral disturbance -resident ALF per admit -PT/OT evaluation to ensure appropriate to return- appears quite deconditioned so suspect will need ST SNF before return to ALF -10/24: OT rec SNF-SW made aware    History of TIA (transient ischemic attack)   DVT prophylaxis: SCDs Code Status: Full Family Communication: Patient only Disposition Plan/Expected LOS: Transfer to telemetry- OT recommending snf- will consult social work Isolation: Higher education careers adviser for MRSA PCR positive status Nutritional Status: Stable  Consultants: Cardiology PCCM  Procedures: Pacemaker implantation  10/20  Antibiotics: Cefazolin 10/20 x 2 doses  HPI/Subjective: Patient alert and without any complaints. Sister at bedside and endorsed pt much weaker than prior to  admission- concerned pt not ready to return to ALF just yet.  Objective: Blood pressure 127/73, pulse 123, temperature 97 F (36.1 C), temperature source Oral, resp. rate 17, height 5\' 3"  (1.6 m), weight 64.2 kg (141 lb 8.6 oz), SpO2 91.00%.  Intake/Output Summary (Last 24 hours) at 04/02/13 1313 Last data filed at 04/01/13 2212  Gross per 24 hour  Intake     63 ml  Output    600 ml  Net   -537 ml     Exam: General: No acute respiratory distress Lungs: Clear to auscultation bilaterally without wheezes or crackles, RA Cardiovascular: Regular rate and rhythm without murmur gallop or rub normal S1 and S2, no peripheral edema or JVD-left anterior chest with dressing clean dry and intact Abdomen: Nontender, nondistended, soft, bowel sounds positive, no rebound, no ascites, no appreciable mass Musculoskeletal: No significant cyanosis, clubbing of bilateral lower extremities-left arm in sling Neurological: Alert and oriented x at least name/?? to place, moves all extremities x 4 without focal neurological deficits, CN 2-12 intact  Scheduled Meds:  Scheduled Meds: . bisacodyl  5 mg Oral QHS  . mirtazapine  15 mg Oral QHS  . mupirocin ointment  1 application Nasal BID  . simvastatin  20 mg Oral QHS  . sodium chloride  3 mL Intravenous Q12H  . venlafaxine XR  150 mg Oral Daily   Continuous Infusions:   **Reviewed in detail by the Attending Physician  Data Reviewed: Basic Metabolic Panel:  Recent Labs Lab 03/28/13 1122 03/29/13 0500 04/01/13 0545  NA 138 141 134*  K 4.2 3.8 3.7  CL 101 106 97  CO2 30 28 26   GLUCOSE 135* 107* 105*  BUN 20 12 11   CREATININE 0.68 0.56 0.49*  CALCIUM 9.6 8.8 8.9  MG 2.4  --  2.1  PHOS  --   --  3.0   Liver Function Tests: No results found for this basename: AST, ALT, ALKPHOS, BILITOT, PROT, ALBUMIN,  in the last 168 hours No results found for this basename: LIPASE, AMYLASE,  in the last 168 hours No results found for this basename: AMMONIA,   in the last 168 hours CBC:  Recent Labs Lab 03/28/13 1122 03/29/13 0500 04/01/13 0545  WBC 8.7 9.7 8.5  NEUTROABS 6.9  --   --   HGB 13.9 12.3 12.9  HCT 41.1 36.7 38.1  MCV 85.6 85.2 84.7  PLT 259 240 322   Cardiac Enzymes:  Recent Labs Lab 03/28/13 1122 03/28/13 2320 03/29/13 0500  TROPONINI <0.30 <0.30 <0.30   BNP (last 3 results) No results found for this basename: PROBNP,  in the last 8760 hours CBG: No results found for this basename: GLUCAP,  in the last 168 hours  Recent Results (from the past 240 hour(s))  MRSA PCR SCREENING     Status: Abnormal   Collection Time    03/28/13  3:59 PM      Result Value Range Status   MRSA by PCR POSITIVE (*) NEGATIVE Final   Comment:            The GeneXpert MRSA Assay (FDA     approved for NASAL specimens     only), is one component of a     comprehensive MRSA colonization     surveillance program. It is not     intended to diagnose MRSA     infection nor to guide or     monitor treatment for     MRSA infections.     RESULT CALLED TO, READ BACK BY AND VERIFIED WITH:     S.[facility] 1804 03/28/13 M.Hase     Studies:  Recent x-ray studies have been reviewed in detail by the Attending Physician     Junious Silk, ANP [facility] Office  [phone] Pager [phone]  **If unable to reach the above provider after paging please contact the Flow Manager @ [phone]  On-Call/Text Page:      [email or web address]      password [email or web address]  If 7PM-7AM, please contact night-coverage [email or web address] Password [email or web address] 04/02/2013, 1:13 PM   LOS: 5 days   I have examined the patient, reviewed the chart and modified the above note which I agree with.   Eluterio Seymour,MD [phone] 04/02/2013, 3:23 PM

## 2013-04-02 NOTE — Consult Note (Signed)
PULMONARY  / CRITICAL CARE MEDICINE  Name: Amy Wilkinson MRN: [id number] DOB: Dec 29, 1921    ADMISSION DATE:  03/28/2013 CONSULTATION DATE: 10-20  REFERRING MD :  Alred PRIMARY SERVICE: [facility]  CHIEF COMPLAINT:  Hypoxia  BRIEF PATIENT DESCRIPTION: 79 yo demented WF with SSS who underwent PPM left on 10-20. She developed hypoxia am of 10-21 and required increasing fio2. CxR revealed moderate left pnx and PCCM asked to evaluate. Tx to ICU and place wayne pnx chest tube.   SIGNIFICANT EVENTS / STUDIES:  10-20 left PPM 10-21 left moderate pnx 10/21 insertion of left wayne chest tube  LINES / TUBES: PIV 10/21 L chest tube>>10/23  CULTURES: None  ANTIBIOTICS: 10-20 ancef>>10/22  HISTORY OF PRESENT ILLNESS:   79 yo demented WF with SSS who underwent PPM left on 10-20. She developed hypoxia am of 10-21 and required increasing fio2. CxR revealed moderate left pnx and PCCM asked to evaluate. Tx to ICU and place wayne pnx chest tube.  SUBJECTIVE: Patient resting comfortably.  No complaints this morning.    VITAL SIGNS: Temp:  [98 F (36.7 C)-98.8 F (37.1 C)] 98.1 F (36.7 C) (10/24 0830) Pulse Rate:  [89] 89 (10/24 0830) Resp:  [16-28] 17 (10/24 0830) BP: (114-140)/(58-71) 116/59 mmHg (10/24 0800) SpO2:  [91 %-96 %] 91 % (10/24 0830) HEMODYNAMICS:   VENTILATOR SETTINGS:   INTAKE / OUTPUT: Intake/Output     10/23 0701 - 10/24 0700 10/24 0701 - 10/25 0700   P.O. 60    I.V. (mL/kg) 3 (0)    Total Intake(mL/kg) 63 (1)    Urine (mL/kg/hr) 600 (0.4)    Total Output 600     Net -537          Urine Occurrence 2 x    Stool Occurrence 1 x      PHYSICAL EXAMINATION: General:  Sleeping  Neuro:  Sleeping, Moves all extremities x4 HEENT: NO JVD/LAN, PERLA, MM pink and moist Cardiovascular:  HSR Lungs:  CTA mildly diminished left apical breath sounds Abdomen: +bs, soft and nontender Musculoskeletal:  intact Skin:  warm  LABS:  CBC Recent Labs     04/01/13  0545   WBC  8.5  HGB  12.9  HCT  38.1  PLT  322   Coag's No results found for this basename: APTT, INR,  in the last 72 hours BMET Recent Labs     04/01/13  0545  NA  134*  K  3.7  CL  97  CO2  26  BUN  11  CREATININE  0.49*  GLUCOSE  105*   Electrolytes Recent Labs     04/01/13  0545  CALCIUM  8.9  MG  2.1  PHOS  3.0   Sepsis Markers No results found for this basename: LACTICACIDVEN, PROCALCITON, O2SATVEN,  in the last 72 hours ABG No results found for this basename: PHART, PCO2ART, PO2ART,  in the last 72 hours Liver Enzymes No results found for this basename: AST, ALT, ALKPHOS, BILITOT, ALBUMIN,  in the last 72 hours Cardiac Enzymes No results found for this basename: TROPONINI, PROBNP,  in the last 72 hours Glucose No results found for this basename: GLUCAP,  in the last 72 hours  Imaging Dg Chest Port 1 View  04/02/2013   CLINICAL DATA:  Follow up pneumothorax. History of syncope, TIA and bradycardia.  EXAM: PORTABLE CHEST - 1 VIEW  COMPARISON:  04/01/2013.  FINDINGS: 0622 hr. The small left apical pneumothorax is unchanged. There are lower lung  volumes with increased left basilar opacity and a probable small left pleural effusion. The right lung is clear. The heart size and mediastinal contours are stable. Pacemaker leads appear unchanged.  IMPRESSION: Unchanged small left apical pneumothorax. Increased left basilar airspace disease and small left pleural effusion.   Electronically Signed   By: Roxy Horseman M.D.   On: 04/02/2013 07:34   Dg Chest Port 1 View  04/01/2013   CLINICAL DATA:  Left apical pneumothorax  EXAM: PORTABLE CHEST - 1 VIEW  COMPARISON:  Prior chest x-ray earlier today at 5:23 a.m.  FINDINGS: Interval removal of the left apical pigtail thoracostomy tube with recurrence of a small apical pneumothorax. Stable position of left subclavian approach cardiac rhythm maintenance device. Persistent but improving left basilar atelectasis. Cardiac and mediastinal  contours are unchanged. No acute osseous abnormality.  IMPRESSION: Recurrent small left apical pneumothorax (less than 5%) following removal of the left pigtail thoracostomy tube.  Improving left basilar atelectasis.  These results will be called to the ordering clinician or representative by the Radiologist Assistant, and communication documented in the PACS Dashboard.   Electronically Signed   By: Malachy Moan M.D.   On: 04/01/2013 16:18   Dg Chest Port 1 View  04/01/2013   CLINICAL DATA:  Pneumothorax and chest tube.  EXAM: PORTABLE CHEST - 1 VIEW  COMPARISON:  03/31/2013  FINDINGS: The left chest tube is in a stable position near the apex. No clear evidence for a left pneumothorax. Slightly increased left basilar densities. Findings most likely are associated with atelectasis. Heart and mediastinum are stable. Stable appearance of the dual lead cardiac pacemaker.  IMPRESSION: Stable position of the left chest tube without a definite pneumothorax.  Left basilar densities are probably associated with volume loss.   Electronically Signed   By: Richarda Overlie M.D.   On: 04/01/2013 07:27   Dg Chest Port 1 View  03/31/2013   CLINICAL DATA:  Chest tube  EXAM: PORTABLE CHEST - 1 VIEW  COMPARISON:  03/31/2013 at 0538 hours  FINDINGS: Stable left apical chest tube. Tiny left apical pneumothorax, possibly minimally decreased. Suspected small left pleural effusion component.  The heart is normal in size. Left subclavian pacemaker.  IMPRESSION: Stable left apical chest tube.  Tiny left apical pneumothorax, possibly minimally decreased.   Electronically Signed   By: Charline Bills M.D.   On: 03/31/2013 13:28    ASSESSMENT / PLAN:  PULMONARY A: Traumatic Pneumothorax s/p wayne chest tube - small apical pneumo remains P:   O2 sats >92% F/u CXR  CARDIOVASCULAR A:  SSS post pacer 10-20  P:  Per cards  RENAL A:   No acute process P:   F/u BMET as needed KVO fluids as patient is eating Monitor  renal fx  GASTROINTESTINAL A:  Nutrition P:   Heart diet  HEMATOLOGIC A:   No acute process P:  F/u CBC as needed  INFECTIOUS A:   Post PPM prophylaxis - complete P:   - 3 doses ancef given  ENDOCRINE A:   No acute process   P:   Monitor blood sugar on BMET  NEUROLOGIC A:   Dementia/Lethargy P:   Monitor mental status  PCCM consulting for traumatic pneumothorax after placement of PPM.  Wayne catheter removed 10/23 F/u CXR post removal showed 5% pneumothorax.  CXR this morning shows unchanged 5% pneumothorax.  Will likely reabsorb on its own.  PCCM will sign off for now.  Please call if further assistance is required.  Carley Hammed Physician Assistant Student 04/02/2013, 9:39 AM  PTX stable between yesterday and today without a chest tube, will absorb with time.  PCCM will sign off, please call back if needed.  Patient seen and examined, agree with above note.  I dictated the care and orders written for this patient under my direction.  Alyson Reedy, MD [phone]

## 2013-04-02 NOTE — Progress Notes (Signed)
Physical Therapy Evaluation Patient Details Name: Amy Wilkinson MRN: [id number] DOB: 26-Nov-1921 Today's Date: 04/02/2013 Time: 7829-5621 PT Time Calculation (min): 34 min  PT Assessment / Plan / Recommendation History of Present Illness  79 y.o. patient was brought to the hospital secondary to syncopal episode. patient was noted to have recurrent symptomatic bradycardia with pauses (approximately 10 seconds) associated with unresponsiveness. Cardiology was asked to evaluate the patient . She underwent pacemaker implantation for symptomatic sinus mode dysfunction on 10/20. Post procedure she was noted to have hypoxia and increasing O2 requirements. Subsequent CXR revealed a pneumonthorax had CT placed which was removed 10/23  Clinical Impression  Pt admitted with above. Pt currently with functional limitations due to the deficits listed below (see PT Problem List). Will need short term NHP secondary to pacemaker precautions and AMS prior to d/c back to A living.   Pt will benefit from skilled PT to increase their independence and safety with mobility to allow discharge to the venue listed below.     PT Assessment  Patient needs continued PT services    Follow Up Recommendations  SNF;Supervision/Assistance - 24 hour                Equipment Recommendations  None recommended by PT         Frequency Min 3X/week    Precautions / Restrictions Precautions Precautions: ICD/Pacemaker;Fall Restrictions Weight Bearing Restrictions: No LUE Weight Bearing: Non weight bearing   Pertinent Vitals/Pain VSS, No pain      Mobility  Bed Mobility Bed Mobility: Supine to Sit;Sitting - Scoot to Edge of Bed Supine to Sit: 4: Min assist;HOB elevated Sitting - Scoot to [place] of Bed: 4: Min assist Details for Bed Mobility Assistance: Pt requires assist to lift shoulders from bed and assist to scoot hips to EOB Transfers Transfers: Sit to Stand;Stand to Sit Sit to Stand: 4: Min assist;With  upper extremity assist;From bed Stand to Sit: 4: Min assist;With upper extremity assist;With armrests;To chair/3-in-1 Details for Transfer Assistance: cues and asssit for hand placement.  Generally steady to RW.   Ambulation/Gait Ambulation/Gait Assistance: 4: Min assist Ambulation Distance (Feet): 375 Feet Assistive device: Rolling walker Ambulation/Gait Assistance Details: Pt ambulated with RW with cues to stay close to rW and to steer RW.  Pt very confused and not oriented throughout.  Replaced sling on pt after ambulation.   Gait Pattern: Decreased stride length;Step-to pattern;Trunk flexed Gait velocity: decreased Stairs: No Wheelchair Mobility Wheelchair Mobility: No         PT Diagnosis: Generalized weakness  PT Problem List: Decreased activity tolerance;Decreased balance;Decreased mobility;Decreased knowledge of use of DME;Decreased safety awareness;Decreased knowledge of precautions PT Treatment Interventions: Gait training;DME instruction;Functional mobility training;Therapeutic activities;Therapeutic exercise;Balance training;Patient/family education     PT Goals(Current goals can be found in the care plan section) Acute Rehab PT Goals Patient Stated Goal: to go home PT Goal Formulation: With patient Time For Goal Achievement: 04/09/13 Potential to Achieve Goals: Good  Visit Information  Last PT Received On: 04/02/13 Assistance Needed: +1 History of Present Illness: 79 y.o. patient was brought to the hospital secondary to syncopal episode. patient was noted to have recurrent symptomatic bradycardia with pauses (approximately 10 seconds) associated with unresponsiveness. Cardiology was asked to evaluate the patient . She underwent pacemaker implantation for symptomatic sinus mode dysfunction on 10/20. Post procedure she was noted to have hypoxia and increasing O2 requirements. Subsequent CXR revealed a pneumonthorax had CT placed which was removed 10/23  Prior  Functioning  Home Living Family/patient expects to be discharged to:: Assisted living Home Equipment: Walker - 4 wheels Additional Comments: Pt resided at [facility] Prior Function Level of Independence: Needs assistance Gait / Transfers Assistance Needed: Per sister, pt ambulated with use of rollator ADL's / Homemaking Assistance Needed: Per sister's report, pt required assist with bathing and dressing.  Pt is poor historian Communication Communication: No difficulties Dominant Hand: Right    Cognition  Cognition Arousal/Alertness: Awake/alert Behavior During Therapy: WFL for tasks assessed/performed Overall Cognitive Status: History of cognitive impairments - at baseline    Extremity/Trunk Assessment Upper Extremity Assessment Upper Extremity Assessment: Defer to OT evaluation LUE: Unable to fully assess due to immobilization (Pacer precautions) Lower Extremity Assessment Lower Extremity Assessment: Generalized weakness;RLE deficits/detail RLE: Unable to fully assess due to pain   Balance Balance Balance Assessed: Yes Static Standing Balance Static Standing - Balance Support: Bilateral upper extremity supported;During functional activity Static Standing - Level of Assistance: 5: Stand by assistance Static Standing - Comment/# of Minutes: 3  End of Session PT - End of Session Equipment Utilized During Treatment: Gait belt Activity Tolerance: Patient tolerated treatment well Patient left: in bed;with call bell/phone within reach;with family/visitor present;with bed alarm set Nurse Communication: Mobility status       INGOLD,Amy Wilkinson 04/02/2013, 4:11 PM [employer] [employer] [facility] [phone] (pager)

## 2013-04-03 LAB — BASIC METABOLIC PANEL
BUN: 13 mg/dL (ref 6–23)
CO2: 29 mEq/L (ref 19–32)
Chloride: 101 mEq/L (ref 96–112)
Creatinine, Ser: 0.87 mg/dL (ref 0.50–1.10)
GFR calc Af Amer: 66 mL/min — ABNORMAL LOW (ref >90)
GFR calc non Af Amer: 57 mL/min — ABNORMAL LOW (ref >90)
Potassium: 3.5 mEq/L (ref 3.5–5.1)

## 2013-04-03 NOTE — Progress Notes (Signed)
Patient ID: Amy Wilkinson  female  ZOX:[id number]    DOB: 1921-10-30    DOA: 03/28/2013  PCP: Allean Found, MD     Assessment/Plan:   Bradycardia/SSS (sick sinus syndrome)  -resolved after pacemaker placement on 03/29/13, cardiology/EP service following  Syncope  -due to symptomatic sinus node dysfunction , status post pacemaker  Pneumothorax, postprocedural  -pigtail catheter removed by PCCM 10/23  -FU CXR residual 5% pneumothorax, per Dr Molli Knock, will resolve over time   Dementia with behavioral disturbance  Patient was resident of assisted-living facility before admission. PT and OT evaluation however recommended skilled nursing facility  History of TIA (transient ischemic attack)   DVT Prophylaxis: SCDs  Code Status:  Disposition: Likely on Monday with skilled nursing facility when available    Subjective: No complaints, patient refusing medical examination, keeps holding the blanket tight to herself. No pain  Objective: Weight change:   Intake/Output Summary (Last 24 hours) at 04/03/13 1058 Last data filed at 04/03/13 0900  Gross per 24 hour  Intake    120 ml  Output      0 ml  Net    120 ml   Blood pressure 127/68, pulse 94, temperature 97.9 F (36.6 C), temperature source Oral, resp. rate 20, height 5\' 3"  (1.6 m), weight 60.7 kg (133 lb 13.1 oz), SpO2 94.00%.  Physical Exam: General: Alert but refusing examination CVS: S1-S2 clear, no murmur rubs or gallops Chest: clear anteriorly, patient refused to turn Abdomen: soft  nondistended, normal bowel sounds  Extremities: no cyanosis, clubbing or edema noted bilaterally, left arm in sling Neuro: Patient refused, left arm in sling  Lab Results: Basic Metabolic Panel:  Recent Labs Lab 04/01/13 0545 04/03/13 0340  NA 134* 139  K 3.7 3.5  CL 97 101  CO2 26 29  GLUCOSE 105* 138*  BUN 11 13  CREATININE 0.49* 0.87  CALCIUM 8.9 8.4  MG 2.1  --   PHOS 3.0  --    Liver Function Tests: No  results found for this basename: AST, ALT, ALKPHOS, BILITOT, PROT, ALBUMIN,  in the last 168 hours No results found for this basename: LIPASE, AMYLASE,  in the last 168 hours No results found for this basename: AMMONIA,  in the last 168 hours CBC:  Recent Labs Lab 03/28/13 1122 03/29/13 0500 04/01/13 0545  WBC 8.7 9.7 8.5  NEUTROABS 6.9  --   --   HGB 13.9 12.3 12.9  HCT 41.1 36.7 38.1  MCV 85.6 85.2 84.7  PLT 259 240 322   Cardiac Enzymes:  Recent Labs Lab 03/28/13 1122 03/28/13 2320 03/29/13 0500  TROPONINI <0.30 <0.30 <0.30   BNP: No components found with this basename: POCBNP,  CBG: No results found for this basename: GLUCAP,  in the last 168 hours   Micro Results: Recent Results (from the past 240 hour(s))  MRSA PCR SCREENING     Status: Abnormal   Collection Time    03/28/13  3:59 PM      Result Value Range Status   MRSA by PCR POSITIVE (*) NEGATIVE Final   Comment:            The GeneXpert MRSA Assay (FDA     approved for NASAL specimens     only), is one component of a     comprehensive MRSA colonization     surveillance program. It is not     intended to diagnose MRSA     infection nor to guide or  monitor treatment for     MRSA infections.     RESULT CALLED TO, READ BACK BY AND VERIFIED WITH:     S.[facility] 1804 03/28/13 M.[place]    Studies/Results: Dg Chest 2 View  03/30/2013   *RADIOLOGY REPORT*  Clinical Data: Post pacemaker implantation  CHEST - 2 VIEW  Comparison: 03/23/2013; 01/09/2012; chest CT - 03/31/2012  Findings:  Grossly unchanged cardiac silhouette and mediastinal contours. Interval placement of left anterior chest wall dual lead pacemaker with lead tips overlying the expected location of the right atrium and ventricle.  Interval development of a small to moderate-sized left-sided pneumothorax.  Mild cephalization of flow without frank evidence of edema.  Trace left-sided pleural effusion.  Unchanged bones.  Post cholecystectomy.   IMPRESSION:  Interval development of a small to moderate-sized left-sided pneumothorax post left anterior chest wall pacemaker implantation.  Critical Value/emergent results were called by telephone at the time of interpretation on 03/30/2013 at 07:39 to [place], [place], who verbally acknowledged these results.   Original Report Authenticated By: Tacey Ruiz, MD   Dg Chest Port 1 View  04/02/2013   CLINICAL DATA:  Follow up pneumothorax. History of syncope, TIA and bradycardia.  EXAM: PORTABLE CHEST - 1 VIEW  COMPARISON:  04/01/2013.  FINDINGS: 0622 hr. The small left apical pneumothorax is unchanged. There are lower lung volumes with increased left basilar opacity and a probable small left pleural effusion. The right lung is clear. The heart size and mediastinal contours are stable. Pacemaker leads appear unchanged.  IMPRESSION: Unchanged small left apical pneumothorax. Increased left basilar airspace disease and small left pleural effusion.   Electronically Signed   By: Roxy Horseman M.D.   On: 04/02/2013 07:34   Dg Chest Port 1 View  04/01/2013   CLINICAL DATA:  Left apical pneumothorax  EXAM: PORTABLE CHEST - 1 VIEW  COMPARISON:  Prior chest x-ray earlier today at 5:23 a.m.  FINDINGS: Interval removal of the left apical pigtail thoracostomy tube with recurrence of a small apical pneumothorax. Stable position of left subclavian approach cardiac rhythm maintenance device. Persistent but improving left basilar atelectasis. Cardiac and mediastinal contours are unchanged. No acute osseous abnormality.  IMPRESSION: Recurrent small left apical pneumothorax (less than 5%) following removal of the left pigtail thoracostomy tube.  Improving left basilar atelectasis.  These results will be called to the ordering clinician or representative by the Radiologist Assistant, and communication documented in the PACS Dashboard.   Electronically Signed   By: Malachy Moan M.D.   On: 04/01/2013 16:18   Dg Chest Port 1  View  04/01/2013   CLINICAL DATA:  Pneumothorax and chest tube.  EXAM: PORTABLE CHEST - 1 VIEW  COMPARISON:  03/31/2013  FINDINGS: The left chest tube is in a stable position near the apex. No clear evidence for a left pneumothorax. Slightly increased left basilar densities. Findings most likely are associated with atelectasis. Heart and mediastinum are stable. Stable appearance of the dual lead cardiac pacemaker.  IMPRESSION: Stable position of the left chest tube without a definite pneumothorax.  Left basilar densities are probably associated with volume loss.   Electronically Signed   By: Richarda Overlie M.D.   On: 04/01/2013 07:27   Dg Chest Port 1 View  03/31/2013   CLINICAL DATA:  Chest tube  EXAM: PORTABLE CHEST - 1 VIEW  COMPARISON:  03/31/2013 at 0538 hours  FINDINGS: Stable left apical chest tube. Tiny left apical pneumothorax, possibly minimally decreased. Suspected small left pleural effusion component.  The heart is normal in size. Left subclavian pacemaker.  IMPRESSION: Stable left apical chest tube.  Tiny left apical pneumothorax, possibly minimally decreased.   Electronically Signed   By: Charline Bills M.D.   On: 03/31/2013 13:28   Dg Chest Port 1 View  03/31/2013   CLINICAL DATA:  Check chest tube position  EXAM: PORTABLE CHEST - 1 VIEW  COMPARISON:  03/30/2013  FINDINGS: The left chest tube is again identified and stable in appearance. There remains a tiny apical pneumothorax stable from the prior study. Small left-sided pleural effusion is seen. The right lung remains clear. The cardiac shadow in pacing device are stable. No bony abnormality is noted.  IMPRESSION: No significant interval change from the prior exam.   Electronically Signed   By: Alcide Clever M.D.   On: 03/31/2013 07:26   Dg Chest Port 1 View  03/30/2013   CLINICAL DATA:  Post left chest tube placement.  Left pneumothorax.  EXAM: PORTABLE CHEST - 1 VIEW  COMPARISON:  03/30/2013  FINDINGS: Pigtail type catheter was  placed in the left chest. Catheter tip is at the left lung apex. Near-complete resolution of the left pneumothorax. There is a tiny amount of left apical pleural air remaining. Densities at the left lung base suggest atelectasis or consolidation. No evidence for mediastinal shift. Heart size is stable. Again noted is a dual lead cardiac pacemaker on the left side.  IMPRESSION: Placement of a left chest tube with near complete resolution of the left pneumothorax.  Left basilar lung densities as described.   Electronically Signed   By: Richarda Overlie M.D.   On: 03/30/2013 16:28   Dg Chest Port 1 View  03/30/2013   CLINICAL DATA:  Left pneumothorax followup  EXAM: PORTABLE CHEST - 1 VIEW  COMPARISON:  Multiple priors  FINDINGS: Left pneumothorax is unchanged to slightly increased from the prior examination. There is no mediastinal shift. Cardiac pacing device is unchanged. Atelectatic changes of the left lung are noted. Possible small amount of left pleural fluid. Mild right basilar atelectasis. Cardiomediastinal silhouette is unchanged. No acute osseous abnormality.  IMPRESSION: 1.  Unchanged to slightly increased left pneumothorax.  2. Left lung atelectatic changes. Possible small left pleural effusion.  2.  Mild right basilar atelectasis.  These results will be called to the ordering clinician or representative by the Radiologist Assistant, and communication documented in the PACS Dashboard.   Electronically Signed   By: Jerene Dilling M.D.   On: 03/30/2013 14:39   Dg Chest Port 1 View  03/30/2013   ADDENDUM REPORT: 03/30/2013 09:00  ADDENDUM: These results will be called to the ordering clinician or representative by the Radiologist Assistant, and communication documented in the PACS Dashboard.   Electronically Signed   By: Malachy Moan M.D.   On: 03/30/2013 09:00   03/30/2013   CLINICAL DATA:  Evaluate pneumothorax  EXAM: PORTABLE CHEST - 1 VIEW  COMPARISON:  Prior chest x-ray earlier today at 6:06  a.m.  FINDINGS: Slight interval enlargement of left pneumothorax which is now 20-25%. Stable positioning of left subclavian approach cardiac rhythm maintenance device. Leads project over the right atrium and right ventricular apex. Stable cardiac and mediastinal contours. Atherosclerotic calcification noted in the transverse aorta. Mild bibasilar atelectasis. No acute osseous abnormality.  IMPRESSION: Similar to slightly enlarged 20- 25% left pneumothorax.  Electronically Signed: By: Malachy Moan M.D. On: 03/30/2013 08:56   Dg Chest Portable 1 View  03/28/2013   CLINICAL DATA:  Status  post code blue:  Emesis, bradycardia  EXAM: PORTABLE CHEST - 1 VIEW  COMPARISON:  Prior chest x-ray 01/09/2012; prior CT chest 03/31/2012  FINDINGS: External defibrillator pads project over the left chest limiting evaluation of the left base. The cardiac and mediastinal contours are stable and within normal limits. No focal airspace consolidation. Chronic bronchitic changes and prominence of the interstitial markings are similar compared to prior. Atherosclerotic calcifications are noted in the transverse aorta. No pneumothorax. Low inspiratory volumes with mild right basilar atelectasis and probable left basilar atelectasis. No acute osseous abnormality.  IMPRESSION: No definite acute active disease. Limited evaluation of the left base secondary to obscuring external defibrillator pads.  Slightly low inspiratory volumes with mild right basilar atelectasis.   Electronically Signed   By: Malachy Moan M.D.   On: 03/28/2013 11:38    Medications: Scheduled Meds: . bisacodyl  5 mg Oral QHS  . mirtazapine  15 mg Oral QHS  . simvastatin  20 mg Oral QHS  . sodium chloride  3 mL Intravenous Q12H  . venlafaxine XR  150 mg Oral Daily      LOS: 6 days   Seretha Estabrooks M.D. [facility] 04/03/2013, 10:58 AM Pager: [phone]  If 7PM-7AM, please contact night-coverage [email or web address] Password [email or web address]

## 2013-04-03 NOTE — Progress Notes (Signed)
CSW attempted to call patient's daughter to discuss SNF placement. CSW left message and is awaiting phone call.   Maree Krabbe, MSW, [facility] Majors [phone]

## 2013-04-03 NOTE — Progress Notes (Signed)
Doing well at this time No further inpatient EP workup planned.  Wound check in my office 04/08/13 at 3pm. Please call with questions

## 2013-04-04 MED ORDER — ASPIRIN EC 81 MG PO TBEC
81.0000 mg | DELAYED_RELEASE_TABLET | Freq: Every day | ORAL | Status: DC
  Administered 2013-04-04 – 2013-04-06 (×3): 81 mg via ORAL
  Filled 2013-04-04 (×3): qty 1

## 2013-04-04 NOTE — Progress Notes (Signed)
Per Maree Krabbe, [facility] Majors, daughter plans to have 24hour care at the ALF unit where patient resides- I have advised MD of this and will plan for d/c back to ALF tomorrow- will update CSW covering tomorrow to further assist with d/c- Reece Levy, MSW, LCSWA [phone] coverage

## 2013-04-04 NOTE — Progress Notes (Signed)
Pt out of bed today. Pt currently in a recliner with her sister beside her since 1300 hrs. Will continue to monitor.

## 2013-04-04 NOTE — Progress Notes (Signed)
Patient ID: Amy Wilkinson  female  WGN:[id number]    DOB: [birth year]    DOA: 03/28/2013  PCP: Amy Found, MD  Interval hx 79 y.o. patient was brought to the hospital secondary to syncopal episode. Patient was noted to have recurrent symptomatic bradycardia with pauses (approximately 10 seconds) associated with unresponsiveness. Cardiology was consulted and pt was subsequently transferred to CCU for closer monitoring. She underwent pacemaker implantation for symptomatic sinus mode dysfunction on 10/20. Post procedure on 10/21, she was noted to have hypoxia and increasing O2 requirements. Subsequent CXR revealed moderate left pneumonthorax. She was transferred to ICU and Amy Wilkinson assumed care. Amy Wilkinson was placed by pulmonary service, removed on 10/23. Followup chest x-ray post removal showed a 5% pneumothorax. Per pulmonology, residual pneumothorax will likely be absorbed on its own. Patient is currently awaiting skilled nursing facility placement.    Assessment/Plan:   Bradycardia/SSS (sick sinus syndrome)  -resolved after pacemaker placement on 03/29/13, cardiology/EP service signed off  - Wound check in Amy Wilkinson office on 04/08/13 at 3pm, no further inpatient cardiac workup planned   Syncope  -due to symptomatic sinus node dysfunction , status post pacemaker  Pneumothorax, postprocedural  -pigtail catheter removed by Amy Wilkinson 10/23  -FU CXR residual 5% pneumothorax, per Dr Amy Wilkinson, will resolve over time  - Will check another chest x-ray tomorrow  Dementia with behavioral disturbance  Patient was resident of assisted-living facility before admission. PT and OT evaluation however recommended skilled nursing facility  History of TIA (transient ischemic attack)  - Continue aspirin and statin  DVT Prophylaxis: SCDs  Code Status:  Disposition: Likely on Monday with skilled nursing facility when available, social worker aware    Subjective: patient alert and  talkative her today, no acute complaints.    Objective: Weight change:  No intake or output data in the 24 hours ending 04/04/13 0930 Blood pressure 133/53, pulse 88, temperature 98.3 F (36.8 C), temperature source Oral, resp. rate 20, height 5\' 3"  (1.6 m), weight 60.7 kg (133 lb 13.1 oz), SpO2 91.00%.  Physical Exam: General: Alert no complaints CVS: S1-S2 clear, no murmur rubs or gallops Chest: CTAB Abdomen: soft  nondistended, normal bowel sounds  Extremities: no cyanosis, clubbing or edema noted bilaterally, left arm in sling   Lab Results: Basic Metabolic Panel:  Recent Labs Lab 04/01/13 0545 04/03/13 0340  NA 134* 139  K 3.7 3.5  CL 97 101  CO2 26 29  GLUCOSE 105* 138*  BUN 11 13  CREATININE 0.49* 0.87  CALCIUM 8.9 8.4  MG 2.1  --   PHOS 3.0  --    CBC:  Recent Labs Lab 03/28/13 1122 03/29/13 0500 04/01/13 0545  WBC 8.7 9.7 8.5  NEUTROABS 6.9  --   --   HGB 13.9 12.3 12.9  HCT 41.1 36.7 38.1  MCV 85.6 85.2 84.7  PLT 259 240 322   Cardiac Enzymes:  Recent Labs Lab 03/28/13 1122 03/28/13 2320 03/29/13 0500  TROPONINI <0.30 <0.30 <0.30   BNP: No components Wilkinson with this basename: POCBNP,  CBG: No results Wilkinson for this basename: GLUCAP,  in the last 168 hours   Micro Results: Recent Results (from the past 240 hour(s))  MRSA PCR SCREENING     Status: Abnormal   Collection Time    03/28/13  3:59 PM      Result Value Range Status   MRSA by PCR POSITIVE (*) NEGATIVE Final   Comment:  The GeneXpert MRSA Assay (FDA     approved for NASAL specimens     only), is one component of a     comprehensive MRSA colonization     surveillance program. It is not     intended to diagnose MRSA     infection nor to guide or     monitor treatment for     MRSA infections.     RESULT CALLED TO, READ BACK BY AND VERIFIED WITH:     S.[facility] 1804 03/28/13 M.Iiams    Studies/Results: Dg Chest 2 View  03/30/2013   *RADIOLOGY REPORT*   Clinical Data: Post pacemaker implantation  CHEST - 2 VIEW  Comparison: 03/23/2013; 01/09/2012; chest CT - 03/31/2012  Findings:  Grossly unchanged cardiac silhouette and mediastinal contours. Interval placement of left anterior chest wall dual lead pacemaker with lead tips overlying the expected location of the right atrium and ventricle.  Interval development of a small to moderate-sized left-sided pneumothorax.  Mild cephalization of flow without frank evidence of edema.  Trace left-sided pleural effusion.  Unchanged bones.  Post cholecystectomy.  IMPRESSION:  Interval development of a small to moderate-sized left-sided pneumothorax post left anterior chest wall pacemaker implantation.  Critical Value/emergent results were called by telephone at the time of interpretation on 03/30/2013 at 07:39 to [place], [place], who verbally acknowledged these results.   Original Report Authenticated By: Tacey Ruiz, MD   Dg Chest Port 1 View  04/02/2013   CLINICAL DATA:  Follow up pneumothorax. History of syncope, TIA and bradycardia.  EXAM: PORTABLE CHEST - 1 VIEW  COMPARISON:  04/01/2013.  FINDINGS: 0622 hr. The small left apical pneumothorax is unchanged. There are lower lung volumes with increased left basilar opacity and a probable small left pleural effusion. The right lung is clear. The heart size and mediastinal contours are stable. Pacemaker leads appear unchanged.  IMPRESSION: Unchanged small left apical pneumothorax. Increased left basilar airspace disease and small left pleural effusion.   Electronically Signed   By: Roxy Horseman M.D.   On: 04/02/2013 07:34   Dg Chest Port 1 View  04/01/2013   CLINICAL DATA:  Left apical pneumothorax  EXAM: PORTABLE CHEST - 1 VIEW  COMPARISON:  Prior chest x-ray earlier today at 5:23 a.m.  FINDINGS: Interval removal of the left apical pigtail thoracostomy Wilkinson with recurrence of a small apical pneumothorax. Stable position of left subclavian approach cardiac rhythm maintenance  device. Persistent but improving left basilar atelectasis. Cardiac and mediastinal contours are unchanged. No acute osseous abnormality.  IMPRESSION: Recurrent small left apical pneumothorax (less than 5%) following removal of the left pigtail thoracostomy Wilkinson.  Improving left basilar atelectasis.  These results will be called to the ordering clinician or representative by the Radiologist Assistant, and communication documented in the PACS Dashboard.   Electronically Signed   By: Malachy Moan M.D.   On: 04/01/2013 16:18   Dg Chest Port 1 View  04/01/2013   CLINICAL DATA:  Pneumothorax and chest Wilkinson.  EXAM: PORTABLE CHEST - 1 VIEW  COMPARISON:  03/31/2013  FINDINGS: The left chest Wilkinson is in a stable position near the apex. No clear evidence for a left pneumothorax. Slightly increased left basilar densities. Findings most likely are associated with atelectasis. Heart and mediastinum are stable. Stable appearance of the dual lead cardiac pacemaker.  IMPRESSION: Stable position of the left chest Wilkinson without a definite pneumothorax.  Left basilar densities are probably associated with volume loss.   Electronically Signed  By: Richarda Overlie M.D.   On: 04/01/2013 07:27   Dg Chest Port 1 View  03/31/2013   CLINICAL DATA:  Chest Wilkinson  EXAM: PORTABLE CHEST - 1 VIEW  COMPARISON:  03/31/2013 at 0538 hours  FINDINGS: Stable left apical chest Wilkinson. Tiny left apical pneumothorax, possibly minimally decreased. Suspected small left pleural effusion component.  The heart is normal in size. Left subclavian pacemaker.  IMPRESSION: Stable left apical chest Wilkinson.  Tiny left apical pneumothorax, possibly minimally decreased.   Electronically Signed   By: Charline Bills M.D.   On: 03/31/2013 13:28   Dg Chest Port 1 View  03/31/2013   CLINICAL DATA:  Check chest Wilkinson position  EXAM: PORTABLE CHEST - 1 VIEW  COMPARISON:  03/30/2013  FINDINGS: The left chest Wilkinson is again identified and stable in appearance. There  remains a tiny apical pneumothorax stable from the prior study. Small left-sided pleural effusion is seen. The right lung remains clear. The cardiac shadow in pacing device are stable. No bony abnormality is noted.  IMPRESSION: No significant interval change from the prior exam.   Electronically Signed   By: Alcide Clever M.D.   On: 03/31/2013 07:26   Dg Chest Port 1 View  03/30/2013   CLINICAL DATA:  Post left chest Wilkinson placement.  Left pneumothorax.  EXAM: PORTABLE CHEST - 1 VIEW  COMPARISON:  03/30/2013  FINDINGS: Pigtail type catheter was placed in the left chest. Catheter tip is at the left lung apex. Near-complete resolution of the left pneumothorax. There is a tiny amount of left apical pleural air remaining. Densities at the left lung base suggest atelectasis or consolidation. No evidence for mediastinal shift. Heart size is stable. Again noted is a dual lead cardiac pacemaker on the left side.  IMPRESSION: Placement of a left chest Wilkinson with near complete resolution of the left pneumothorax.  Left basilar lung densities as described.   Electronically Signed   By: Richarda Overlie M.D.   On: 03/30/2013 16:28   Dg Chest Port 1 View  03/30/2013   CLINICAL DATA:  Left pneumothorax followup  EXAM: PORTABLE CHEST - 1 VIEW  COMPARISON:  Multiple priors  FINDINGS: Left pneumothorax is unchanged to slightly increased from the prior examination. There is no mediastinal shift. Cardiac pacing device is unchanged. Atelectatic changes of the left lung are noted. Possible small amount of left pleural fluid. Mild right basilar atelectasis. Cardiomediastinal silhouette is unchanged. No acute osseous abnormality.  IMPRESSION: 1.  Unchanged to slightly increased left pneumothorax.  2. Left lung atelectatic changes. Possible small left pleural effusion.  2.  Mild right basilar atelectasis.  These results will be called to the ordering clinician or representative by the Radiologist Assistant, and communication documented in  the PACS Dashboard.   Electronically Signed   By: Jerene Dilling M.D.   On: 03/30/2013 14:39   Dg Chest Port 1 View  03/30/2013   ADDENDUM REPORT: 03/30/2013 09:00  ADDENDUM: These results will be called to the ordering clinician or representative by the Radiologist Assistant, and communication documented in the PACS Dashboard.   Electronically Signed   By: Malachy Moan M.D.   On: 03/30/2013 09:00   03/30/2013   CLINICAL DATA:  Evaluate pneumothorax  EXAM: PORTABLE CHEST - 1 VIEW  COMPARISON:  Prior chest x-ray earlier today at 6:06 a.m.  FINDINGS: Slight interval enlargement of left pneumothorax which is now 20-25%. Stable positioning of left subclavian approach cardiac rhythm maintenance device. Leads project over the  right atrium and right ventricular apex. Stable cardiac and mediastinal contours. Atherosclerotic calcification noted in the transverse aorta. Mild bibasilar atelectasis. No acute osseous abnormality.  IMPRESSION: Similar to slightly enlarged 20- 25% left pneumothorax.  Electronically Signed: By: Malachy Moan M.D. On: 03/30/2013 08:56   Dg Chest Portable 1 View  03/28/2013   CLINICAL DATA:  Status post code blue:  Emesis, bradycardia  EXAM: PORTABLE CHEST - 1 VIEW  COMPARISON:  Prior chest x-ray 01/09/2012; prior CT chest 03/31/2012  FINDINGS: External defibrillator pads project over the left chest limiting evaluation of the left base. The cardiac and mediastinal contours are stable and within normal limits. No focal airspace consolidation. Chronic bronchitic changes and prominence of the interstitial markings are similar compared to prior. Atherosclerotic calcifications are noted in the transverse aorta. No pneumothorax. Low inspiratory volumes with mild right basilar atelectasis and probable left basilar atelectasis. No acute osseous abnormality.  IMPRESSION: No definite acute active disease. Limited evaluation of the left base secondary to obscuring external defibrillator  pads.  Slightly low inspiratory volumes with mild right basilar atelectasis.   Electronically Signed   By: Malachy Moan M.D.   On: 03/28/2013 11:38    Medications: Scheduled Meds: . bisacodyl  5 mg Oral QHS  . mirtazapine  15 mg Oral QHS  . simvastatin  20 mg Oral QHS  . sodium chloride  3 mL Intravenous Q12H  . venlafaxine XR  150 mg Oral Daily      LOS: 7 days   Jarick Harkins M.D. [facility] 04/04/2013, 9:30 AM Pager: [phone]  If 7PM-7AM, please contact night-coverage [email or web address] Password [email or web address]

## 2013-04-05 ENCOUNTER — Inpatient Hospital Stay (HOSPITAL_COMMUNITY): Payer: Medicare Other

## 2013-04-05 MED ORDER — VENLAFAXINE HCL ER 150 MG PO CP24: 150.0000 mg | ORAL_CAPSULE | Freq: Every day | ORAL | Status: AC

## 2013-04-05 MED ORDER — LORAZEPAM 0.5 MG PO TABS: 0.5000 mg | ORAL_TABLET | Freq: Four times a day (QID) | ORAL | Status: AC | PRN

## 2013-04-05 MED ORDER — HYDROCODONE-ACETAMINOPHEN 5-325 MG PO TABS: 1.0000 | ORAL_TABLET | Freq: Two times a day (BID) | ORAL | Status: DC | PRN

## 2013-04-05 MED ORDER — LORAZEPAM 0.5 MG PO TABS: 0.5000 mg | ORAL_TABLET | Freq: Four times a day (QID) | ORAL | Status: DC | PRN

## 2013-04-05 MED ORDER — MIRTAZAPINE 15 MG PO TABS: 15.0000 mg | ORAL_TABLET | Freq: Every day | ORAL | Status: AC

## 2013-04-05 MED ORDER — HYDROCODONE-ACETAMINOPHEN 5-325 MG PO TABS: 1.0000 | ORAL_TABLET | Freq: Four times a day (QID) | ORAL | Status: DC | PRN

## 2013-04-05 NOTE — Progress Notes (Signed)
PULMONARY  / CRITICAL CARE MEDICINE  Name: Amy Wilkinson MRN: [id number] DOB: 1921-07-02    ADMISSION DATE:  03/28/2013 CONSULTATION DATE: 10-20  REFERRING MD :  Alred PRIMARY SERVICE: [facility]  CHIEF COMPLAINT:  Hypoxia  BRIEF PATIENT DESCRIPTION: 79 yo demented WF with SSS who underwent PPM left on 10-20. She developed hypoxia am of 10-21 and required increasing fio2. CxR revealed moderate left pnx and PCCM asked to evaluate. Tx to ICU and place wayne pnx chest tube.   SIGNIFICANT EVENTS / STUDIES:  10-20 left PPM 10-21 left moderate pnx 10/21 insertion of left wayne chest tube  LINES / TUBES: PIV 10/21 L chest tube>>10/23  CULTURES: None  ANTIBIOTICS: 10-20 ancef>>10/22  HISTORY OF PRESENT ILLNESS:   79 yo demented WF with SSS who underwent PPM left on 10-20. She developed hypoxia am of 10-21 and required increasing fio2. CxR revealed moderate left pnx and PCCM asked to evaluate. Tx to ICU and place wayne pnx chest tube.  SUBJECTIVE: Patient is resting comfortably in chair.  She states she has no shortness of breath, no CP, and is pleasantly alert.  Nephew and sister are present.   VITAL SIGNS: Temp:  [97.9 F (36.6 C)-98.7 F (37.1 C)] 98.7 F (37.1 C) (10/27 0513) Pulse Rate:  [86-90] 86 (10/27 0513) Resp:  [16-20] 16 (10/27 0513) BP: (125-133)/(53-68) 133/67 mmHg (10/27 0513) SpO2:  [92 %-94 %] 94 % (10/27 0513) HEMODYNAMICS:   VENTILATOR SETTINGS:   INTAKE / OUTPUT: Intake/Output     10/26 0701 - 10/27 0700 10/27 0701 - 10/28 0700   P.O. 240    Total Intake(mL/kg) 240 (4)    Net +240            PHYSICAL EXAMINATION: General:  Sitting upright in chair Neuro:  Moves all extremities x4, able to participate in conversation, follows commands HEENT: NO JVD/LAN, PERLA, MM pink and moist Cardiovascular:  HSR, no pretibial edema Lungs:  CTA mildly diminished breath sounds on left especially apically and laterally.   Abdomen: +bs, soft and  nontender Musculoskeletal:  intact Skin:  warm  LABS:  CBC No results found for this basename: WBC, HGB, HCT, PLT,  in the last 72 hours Coag's No results found for this basename: APTT, INR,  in the last 72 hours BMET Recent Labs     04/03/13  0340  NA  139  K  3.5  CL  101  CO2  29  BUN  13  CREATININE  0.87  GLUCOSE  138*   Electrolytes Recent Labs     04/03/13  0340  CALCIUM  8.4   Sepsis Markers No results found for this basename: LACTICACIDVEN, PROCALCITON, O2SATVEN,  in the last 72 hours ABG No results found for this basename: PHART, PCO2ART, PO2ART,  in the last 72 hours Liver Enzymes No results found for this basename: AST, ALT, ALKPHOS, BILITOT, ALBUMIN,  in the last 72 hours Cardiac Enzymes No results found for this basename: TROPONINI, PROBNP,  in the last 72 hours Glucose No results found for this basename: GLUCAP,  in the last 72 hours  Imaging Dg Chest 2 View  04/05/2013   CLINICAL DATA:  Pacemaker placement.  EXAM: CHEST  2 VIEW  COMPARISON:  04/02/2013  FINDINGS: The left pneumothorax has enlarged and is now 15%. Stable dual lead left subclavian pacemaker and leads. Low volumes. Left fusion.  IMPRESSION: Left pneumothorax is now 15% and worse.   Electronically Signed   By: Mia Creek.D.  On: 04/05/2013 07:51    ASSESSMENT / PLAN:  PULMONARY A: Traumatic Pneumothorax s/p wayne chest tube - small apical pneumo remains P:   O2 sats >92% F/u CXR 10/28  CARDIOVASCULAR A:  SSS post pacer 10-20  P:  Per cards  RENAL A:   No acute process P:   Monitor renal fx  GASTROINTESTINAL A:  Nutrition P:   Heart diet  HEMATOLOGIC A:   No acute process P:   INFECTIOUS A:   Post PPM prophylaxis - complete No acute process P:   - 3 doses ancef given  ENDOCRINE A:   No acute process   P:   Monitor blood sugars  NEUROLOGIC A:   Dementia/Lethargy P:   Monitor mental status  Carley Hammed Physician Assistant  Student 04/05/2013, 1:14 PM  PCCM consulting for traumatic pneumothorax after placement of PPM.  Wayne catheter removed 10/23 F/u CXR post removal showed 5% pneumothorax.  CXR 10/27 showed worsening of PTX to 15%.  Likely technique related (AP vs PA).  Will repeat CXR in AM, if no change then will be safe to transfer to SNF.  Pt's nephew and sister were present and were updated on current plan.      Patient seen and examined, agree with above note.  I dictated the care and orders written for this patient under my direction.  Alyson Reedy, MD [phone]

## 2013-04-05 NOTE — Progress Notes (Addendum)
CSW (Clinical Social Worker) left message for facility to notify of possible dc for today.  Addendum: CSW left second voicemail for facility asking for return call.  Arienne Gartin, LCSWA [phone]

## 2013-04-05 NOTE — Progress Notes (Signed)
Patient ID: Amy Wilkinson  female  HYQ:[id number]    DOB: January 31, 1922    DOA: 03/28/2013  PCP: Allean Found, MD  Interval hx 79 y.o. patient was brought to the hospital secondary to syncopal episode. Patient was noted to have recurrent symptomatic bradycardia with pauses (approximately 10 seconds) associated with unresponsiveness. Cardiology was consulted and pt was subsequently transferred to CCU for closer monitoring. She underwent pacemaker implantation for symptomatic sinus mode dysfunction on 10/20. Post procedure on 10/21, she was noted to have hypoxia and increasing O2 requirements. Subsequent CXR revealed moderate left pneumonthorax. She was transferred to ICU and PCCM assumed care. Wayne chest tube was placed by pulmonary service, removed on 10/23. Followup chest x-ray post removal showed a 5% pneumothorax. Per pulmonology, residual pneumothorax will likely be absorbed on its own. Patient is currently awaiting skilled nursing facility placement.    Assessment/Plan:   Bradycardia/SSS (sick sinus syndrome)  -resolved after pacemaker placement on 03/29/13, cardiology/EP service signed off  - Wound check in Dr. Jenel Lucks office on 04/08/13 at 3pm, no further inpatient cardiac workup planned   Syncope  -due to symptomatic sinus node dysfunction , status post pacemaker  Pneumothorax, postprocedural  -pigtail catheter removed by [facility] 10/23  -FU CXR residual 5% pneumothorax, per Dr Molli Knock, will resolve over time  - Chest x-ray showed a 15% pneumothorax, enlarging however patient is asymptomatic, no hypoxia, chest pain or shortness of breath. Pulmonary consult obtained to reevaluate again.  Dementia with behavioral disturbance  Patient was resident of assisted-living facility before admission. PT and OT evaluation however recommended skilled nursing facility  History of TIA (transient ischemic attack)  - Continue aspirin and statin  DVT Prophylaxis: SCDs  Code  Status:  Disposition: Await pulmonology recommendations, likely DC in a.m. if stable   Subjective: No complaints, patient much more alert and awake. Denies any complaints, chest pain or shortness of breath   Objective: Weight change:   Intake/Output Summary (Last 24 hours) at 04/05/13 1252 Last data filed at 04/04/13 1753  Gross per 24 hour  Intake    240 ml  Output      0 ml  Net    240 ml   Blood pressure 133/67, pulse 86, temperature 98.7 F (37.1 C), temperature source Oral, resp. rate 16, height 5\' 3"  (1.6 m), weight 60.7 kg (133 lb 13.1 oz), SpO2 94.00%.  Physical Exam: General: Alert no complaints, NAD CVS: S1-S2 clear Chest: CTAB Abdomen: soft  nondistended, normal bowel sounds  Extremities: no c/c/e bilaterally, left arm in sling   Lab Results: Basic Metabolic Panel:  Recent Labs Lab 04/01/13 0545 04/03/13 0340  NA 134* 139  K 3.7 3.5  CL 97 101  CO2 26 29  GLUCOSE 105* 138*  BUN 11 13  CREATININE 0.49* 0.87  CALCIUM 8.9 8.4  MG 2.1  --   PHOS 3.0  --    CBC:  Recent Labs Lab 04/01/13 0545  WBC 8.5  HGB 12.9  HCT 38.1  MCV 84.7  PLT 322   Cardiac Enzymes: No results found for this basename: CKTOTAL, CKMB, CKMBINDEX, TROPONINI,  in the last 168 hours BNP: No components found with this basename: POCBNP,  CBG: No results found for this basename: GLUCAP,  in the last 168 hours   Micro Results: Recent Results (from the past 240 hour(s))  MRSA PCR SCREENING     Status: Abnormal   Collection Time    03/28/13  3:59 PM      Result Value  Range Status   MRSA by PCR POSITIVE (*) NEGATIVE Final   Comment:            The GeneXpert MRSA Assay (FDA     approved for NASAL specimens     only), is one component of a     comprehensive MRSA colonization     surveillance program. It is not     intended to diagnose MRSA     infection nor to guide or     monitor treatment for     MRSA infections.     RESULT CALLED TO, READ BACK BY AND VERIFIED  WITH:     S.[facility] 1804 03/28/13 M.Mancera    Studies/Results: Dg Chest 2 View  03/30/2013   *RADIOLOGY REPORT*  Clinical Data: Post pacemaker implantation  CHEST - 2 VIEW  Comparison: 03/23/2013; 01/09/2012; chest CT - 03/31/2012  Findings:  Grossly unchanged cardiac silhouette and mediastinal contours. Interval placement of left anterior chest wall dual lead pacemaker with lead tips overlying the expected location of the right atrium and ventricle.  Interval development of a small to moderate-sized left-sided pneumothorax.  Mild cephalization of flow without frank evidence of edema.  Trace left-sided pleural effusion.  Unchanged bones.  Post cholecystectomy.  IMPRESSION:  Interval development of a small to moderate-sized left-sided pneumothorax post left anterior chest wall pacemaker implantation.  Critical Value/emergent results were called by telephone at the time of interpretation on 03/30/2013 at 07:39 to [place], [place], who verbally acknowledged these results.   Original Report Authenticated By: Tacey Ruiz, MD   Dg Chest Port 1 View  04/02/2013   CLINICAL DATA:  Follow up pneumothorax. History of syncope, TIA and bradycardia.  EXAM: PORTABLE CHEST - 1 VIEW  COMPARISON:  04/01/2013.  FINDINGS: 0622 hr. The small left apical pneumothorax is unchanged. There are lower lung volumes with increased left basilar opacity and a probable small left pleural effusion. The right lung is clear. The heart size and mediastinal contours are stable. Pacemaker leads appear unchanged.  IMPRESSION: Unchanged small left apical pneumothorax. Increased left basilar airspace disease and small left pleural effusion.   Electronically Signed   By: Roxy Horseman M.D.   On: 04/02/2013 07:34   Dg Chest Port 1 View  04/01/2013   CLINICAL DATA:  Left apical pneumothorax  EXAM: PORTABLE CHEST - 1 VIEW  COMPARISON:  Prior chest x-ray earlier today at 5:23 a.m.  FINDINGS: Interval removal of the left apical pigtail thoracostomy  tube with recurrence of a small apical pneumothorax. Stable position of left subclavian approach cardiac rhythm maintenance device. Persistent but improving left basilar atelectasis. Cardiac and mediastinal contours are unchanged. No acute osseous abnormality.  IMPRESSION: Recurrent small left apical pneumothorax (less than 5%) following removal of the left pigtail thoracostomy tube.  Improving left basilar atelectasis.  These results will be called to the ordering clinician or representative by the Radiologist Assistant, and communication documented in the PACS Dashboard.   Electronically Signed   By: Malachy Moan M.D.   On: 04/01/2013 16:18   Dg Chest Port 1 View  04/01/2013   CLINICAL DATA:  Pneumothorax and chest tube.  EXAM: PORTABLE CHEST - 1 VIEW  COMPARISON:  03/31/2013  FINDINGS: The left chest tube is in a stable position near the apex. No clear evidence for a left pneumothorax. Slightly increased left basilar densities. Findings most likely are associated with atelectasis. Heart and mediastinum are stable. Stable appearance of the dual lead cardiac pacemaker.  IMPRESSION: Stable position  of the left chest tube without a definite pneumothorax.  Left basilar densities are probably associated with volume loss.   Electronically Signed   By: Richarda Overlie M.D.   On: 04/01/2013 07:27   Dg Chest Port 1 View  03/31/2013   CLINICAL DATA:  Chest tube  EXAM: PORTABLE CHEST - 1 VIEW  COMPARISON:  03/31/2013 at 0538 hours  FINDINGS: Stable left apical chest tube. Tiny left apical pneumothorax, possibly minimally decreased. Suspected small left pleural effusion component.  The heart is normal in size. Left subclavian pacemaker.  IMPRESSION: Stable left apical chest tube.  Tiny left apical pneumothorax, possibly minimally decreased.   Electronically Signed   By: Charline Bills M.D.   On: 03/31/2013 13:28   Dg Chest Port 1 View  03/31/2013   CLINICAL DATA:  Check chest tube position  EXAM: PORTABLE CHEST  - 1 VIEW  COMPARISON:  03/30/2013  FINDINGS: The left chest tube is again identified and stable in appearance. There remains a tiny apical pneumothorax stable from the prior study. Small left-sided pleural effusion is seen. The right lung remains clear. The cardiac shadow in pacing device are stable. No bony abnormality is noted.  IMPRESSION: No significant interval change from the prior exam.   Electronically Signed   By: Alcide Clever M.D.   On: 03/31/2013 07:26   Dg Chest Port 1 View  03/30/2013   CLINICAL DATA:  Post left chest tube placement.  Left pneumothorax.  EXAM: PORTABLE CHEST - 1 VIEW  COMPARISON:  03/30/2013  FINDINGS: Pigtail type catheter was placed in the left chest. Catheter tip is at the left lung apex. Near-complete resolution of the left pneumothorax. There is a tiny amount of left apical pleural air remaining. Densities at the left lung base suggest atelectasis or consolidation. No evidence for mediastinal shift. Heart size is stable. Again noted is a dual lead cardiac pacemaker on the left side.  IMPRESSION: Placement of a left chest tube with near complete resolution of the left pneumothorax.  Left basilar lung densities as described.   Electronically Signed   By: Richarda Overlie M.D.   On: 03/30/2013 16:28   Dg Chest Port 1 View  03/30/2013   CLINICAL DATA:  Left pneumothorax followup  EXAM: PORTABLE CHEST - 1 VIEW  COMPARISON:  Multiple priors  FINDINGS: Left pneumothorax is unchanged to slightly increased from the prior examination. There is no mediastinal shift. Cardiac pacing device is unchanged. Atelectatic changes of the left lung are noted. Possible small amount of left pleural fluid. Mild right basilar atelectasis. Cardiomediastinal silhouette is unchanged. No acute osseous abnormality.  IMPRESSION: 1.  Unchanged to slightly increased left pneumothorax.  2. Left lung atelectatic changes. Possible small left pleural effusion.  2.  Mild right basilar atelectasis.  These results will  be called to the ordering clinician or representative by the Radiologist Assistant, and communication documented in the PACS Dashboard.   Electronically Signed   By: Jerene Dilling M.D.   On: 03/30/2013 14:39   Dg Chest Port 1 View  03/30/2013   ADDENDUM REPORT: 03/30/2013 09:00  ADDENDUM: These results will be called to the ordering clinician or representative by the Radiologist Assistant, and communication documented in the PACS Dashboard.   Electronically Signed   By: Malachy Moan M.D.   On: 03/30/2013 09:00   03/30/2013   CLINICAL DATA:  Evaluate pneumothorax  EXAM: PORTABLE CHEST - 1 VIEW  COMPARISON:  Prior chest x-ray earlier today at 6:06 a.m.  FINDINGS: Slight interval enlargement of left pneumothorax which is now 20-25%. Stable positioning of left subclavian approach cardiac rhythm maintenance device. Leads project over the right atrium and right ventricular apex. Stable cardiac and mediastinal contours. Atherosclerotic calcification noted in the transverse aorta. Mild bibasilar atelectasis. No acute osseous abnormality.  IMPRESSION: Similar to slightly enlarged 20- 25% left pneumothorax.  Electronically Signed: By: Malachy Moan M.D. On: 03/30/2013 08:56   Dg Chest Portable 1 View  03/28/2013   CLINICAL DATA:  Status post code blue:  Emesis, bradycardia  EXAM: PORTABLE CHEST - 1 VIEW  COMPARISON:  Prior chest x-ray 01/09/2012; prior CT chest 03/31/2012  FINDINGS: External defibrillator pads project over the left chest limiting evaluation of the left base. The cardiac and mediastinal contours are stable and within normal limits. No focal airspace consolidation. Chronic bronchitic changes and prominence of the interstitial markings are similar compared to prior. Atherosclerotic calcifications are noted in the transverse aorta. No pneumothorax. Low inspiratory volumes with mild right basilar atelectasis and probable left basilar atelectasis. No acute osseous abnormality.  IMPRESSION:  No definite acute active disease. Limited evaluation of the left base secondary to obscuring external defibrillator pads.  Slightly low inspiratory volumes with mild right basilar atelectasis.   Electronically Signed   By: Malachy Moan M.D.   On: 03/28/2013 11:38    Medications: Scheduled Meds: . aspirin EC  81 mg Oral Daily  . bisacodyl  5 mg Oral QHS  . mirtazapine  15 mg Oral QHS  . simvastatin  20 mg Oral QHS  . sodium chloride  3 mL Intravenous Q12H  . venlafaxine XR  150 mg Oral Daily      LOS: 8 days   RAI,RIPUDEEP M.D. [facility] 04/05/2013, 12:52 PM Pager: [phone]  If 7PM-7AM, please contact night-coverage [email or web address] Password [email or web address]

## 2013-04-05 NOTE — Progress Notes (Signed)
Physical Therapy Treatment Patient Details Name: Amy Wilkinson MRN: [id number] DOB: Sep 08, 1921 Today's Date: 04/05/2013 Time: 2440-1027 PT Time Calculation (min): 35 min  PT Assessment / Plan / Recommendation  History of Present Illness 79 y.o. patient was brought to the hospital secondary to syncopal episode. patient was noted to have recurrent symptomatic bradycardia with pauses (approximately 10 seconds) associated with unresponsiveness. Cardiology was asked to evaluate the patient . She underwent pacemaker implantation for symptomatic sinus mode dysfunction on 10/20. Post procedure she was noted to have hypoxia and increasing O2 requirements. Subsequent CXR revealed a pneumonthorax had CT placed which was removed 10/23   PT Comments   Pt did well with mobility using rollator.  Probably isn't far from her baseline.  Feel she can return to ALF with some initial PT there.  Follow Up Recommendations  Other (comment) (Return to ALF and follow-up PT there.)     Does the patient have the potential to tolerate intense rehabilitation     Barriers to Discharge        Equipment Recommendations  None recommended by PT    Recommendations for Other Services    Frequency Min 3X/week   Progress towards PT Goals Progress towards PT goals: Progressing toward goals  Plan Discharge plan needs to be updated    Precautions / Restrictions Precautions Precautions: ICD/Pacemaker;Fall   Pertinent Vitals/Pain No c/o's    Mobility  Transfers Sit to Stand: 5: Supervision;With upper extremity assist;From chair/3-in-1;With armrests;From bed Stand to Sit: 5: Supervision;With upper extremity assist;With armrests;To chair/3-in-1;To bed Ambulation/Gait Ambulation/Gait Assistance: 5: Supervision Ambulation Distance (Feet): 350 Feet Assistive device: 4-wheeled walker Ambulation/Gait Assistance Details: Pt able to use rollator appropriately.  No cues needed and no loss of balance. Gait Pattern:  Step-through pattern;Decreased stride length Gait velocity: decreased    Exercises     PT Diagnosis:    PT Problem List:   PT Treatment Interventions:     PT Goals (current goals can now be found in the care plan section)    Visit Information  Last PT Received On: 04/05/13 Assistance Needed: +1 History of Present Illness: 79 y.o. patient was brought to the hospital secondary to syncopal episode. patient was noted to have recurrent symptomatic bradycardia with pauses (approximately 10 seconds) associated with unresponsiveness. Cardiology was asked to evaluate the patient . She underwent pacemaker implantation for symptomatic sinus mode dysfunction on 10/20. Post procedure she was noted to have hypoxia and increasing O2 requirements. Subsequent CXR revealed a pneumonthorax had CT placed which was removed 10/23    Subjective Data      Cognition  Cognition Arousal/Alertness: Awake/alert Behavior During Therapy: WFL for tasks assessed/performed Overall Cognitive Status: History of cognitive impairments - at baseline    Balance  Static Standing Balance Static Standing - Balance Support: Bilateral upper extremity supported Static Standing - Level of Assistance: 6: Modified independent (Device/Increase time)  End of Session PT - End of Session Equipment Utilized During Treatment: Gait belt Activity Tolerance: Patient tolerated treatment well Patient left: in chair;with call bell/phone within reach;with family/visitor present (told visitors to let staff know if they were leaving the room.) Nurse Communication: Mobility status   GP     [facility] 04/05/2013, 12:23 PM  [employer] PT [phone]

## 2013-04-05 NOTE — Progress Notes (Signed)
Occupational Therapy Treatment Patient Details Name: Amy Wilkinson MRN: [id number] DOB: Oct 31, 1921 Today's Date: 04/05/2013 Time: 1191-4782 OT Time Calculation (min): 32 min  OT Assessment / Plan / Recommendation  History of present illness 79 y.o. patient was brought to the hospital secondary to syncopal episode. patient was noted to have recurrent symptomatic bradycardia with pauses (approximately 10 seconds) associated with unresponsiveness. Cardiology was asked to evaluate the patient . She underwent pacemaker implantation for symptomatic sinus mode dysfunction on 10/20. Post procedure she was noted to have hypoxia and increasing O2 requirements. Subsequent CXR revealed a pneumonthorax had CT placed which was removed 10/23   OT comments  Pt continues to require assist for ADLs due to pacemaker precautions and limited use of Lt. UE coupled with dementia and difficulty adhering to precautions. Pt requires increased (Mod) VC's & TC's as well as one step commands during functional mobility and transfers. Does better w/ rollator vs RW noted.     Follow Up Recommendations  SNF;Supervision/Assistance - 24 hour;Home health OT    Barriers to Discharge       Equipment Recommendations  3 in 1 bedside comode    Recommendations for Other Services    Frequency Min 2X/week   Progress towards OT Goals Progress towards OT goals: Progressing toward goals  Plan Discharge plan remains appropriate    Precautions / Restrictions Precautions Precautions: ICD/Pacemaker;Fall;Other (comment) (pacer precautions) Restrictions Weight Bearing Restrictions: No LUE Weight Bearing: Non weight bearing   Pertinent Vitals/Pain No, denies pain.     ADL  Eating/Feeding: Performed;Set up Where Assessed - Eating/Feeding: Chair Grooming: Performed;Wash/dry hands;Min guard Where Assessed - Grooming: Supported standing;Unsupported standing Toilet Transfer: Performed;Min guard Statistician Method: Sit to  Barista: Raised toilet seat with arms (or 3-in-1 over toilet);Grab bars Toileting - Clothing Manipulation and Hygiene: Minimal assistance Where Assessed - Engineer, mining and Hygiene: Standing Tub/Shower Transfer Method: Not assessed Equipment Used: Gait belt;Rolling walker;Other (comment) (Note, pt does better w/ rollator per PT) Transfers/Ambulation Related to ADLs: Pt ambulates in room w/ RW & Min A (per P.T. today, pt does better w/ rollator) ADL Comments: Pt continues to require assist for ADLs due to pacemaker precautions and limited use of Lt. UE coupled with dementia and difficulty adhering to precautions. Pt requires increased (Mod) VC's & TC's as well as one step commands.    OT Diagnosis:    OT Problem List:   OT Treatment Interventions:     OT Goals(current goals can now be found in the care plan section) Acute Rehab OT Goals Patient Stated Goal: to go home to ALF w/ 24/hr assist  Visit Information  Last OT Received On: 04/05/13 Assistance Needed: +1 History of Present Illness: 79 y.o. patient was brought to the hospital secondary to syncopal episode. patient was noted to have recurrent symptomatic bradycardia with pauses (approximately 10 seconds) associated with unresponsiveness. Cardiology was asked to evaluate the patient . She underwent pacemaker implantation for symptomatic sinus mode dysfunction on 10/20. Post procedure she was noted to have hypoxia and increasing O2 requirements. Subsequent CXR revealed a pneumonthorax had CT placed which was removed 10/23    Subjective Data      Prior Functioning       Cognition  Cognition Arousal/Alertness: Awake/alert Behavior During Therapy: [facility] for tasks assessed/performed Overall Cognitive Status: History of cognitive impairments - at baseline Memory: Decreased recall of precautions;Decreased short-term memory    Mobility  Bed Mobility Bed Mobility: Not assessed (Pt up in  chair) Transfers Transfers: Sit to Stand;Stand to Sit Sit to Stand: 5: Supervision;With upper extremity assist;From chair/3-in-1;With armrests Stand to Sit: 5: Supervision;With upper extremity assist;With armrests;To chair/3-in-1;4: Min guard Details for Transfer Assistance: verbal cues and asssit for hand placement.  Generally steady to RW, however, PT reports that pt does better w/ rollator.          Balance Balance Balance Assessed: Yes Static Standing Balance Static Standing - Balance Support: No upper extremity supported;During functional activity Static Standing - Level of Assistance: 5: Stand by assistance Static Standing - Comment/# of Minutes: 3 min standing at sink during grooming   End of Session OT - End of Session Equipment Utilized During Treatment: Gait belt;Rolling walker Activity Tolerance: Patient tolerated treatment well Patient left: in chair;with call bell/phone within reach;with family/visitor present  GO     Roselie Awkward Dixon 04/05/2013, 1:04 PM

## 2013-04-06 ENCOUNTER — Inpatient Hospital Stay (HOSPITAL_COMMUNITY): Payer: Medicare Other

## 2013-04-06 NOTE — Progress Notes (Signed)
CSW (Clinical Child psychotherapist) received call from [place] with [facility] who confirmed that pt can dc back. CSW faxed signed copy of FL2 as requested. Non-emergent ambulance transport arranged. Pt family, facility, and nurse aware. CSW signing off.  [place]ania Basham, LCSWA [phone]

## 2013-04-06 NOTE — Progress Notes (Addendum)
CSW (Clinical Child psychotherapist) has left multiple messages for facility to confirm receipt of FL2 and discharge summary. Awaiting confirmation that pt is okay to dc back to facility.  Addendum 2:50pm: CSW called facility again and left message with Arline Asp for [facility] to please call CSW as soon as FL2 has been reviewed to confirm that pt can be discharged back to facility.  Tonesha Tsou, LCSWA [phone]

## 2013-04-06 NOTE — Discharge Summary (Signed)
Physician Discharge Summary  Patient ID: Amy Wilkinson MRN: [id number] DOB/AGE: Apr 04, 1922 79 y.o.  Admit date: 03/28/2013 Discharge date: 04/06/2013  Primary Care Physician:  Allean Found, MD  Discharge Diagnoses:    . Bradycardia status post pacemaker placement on 03/29/2013  . Syncope . Dementia with behavioral disturbance . SSS (sick sinus syndrome) Pneumothorax, postprocedural, stable for now  Consults:  Cardiology, Dr. Johney Frame                      Pulmonology, Dr. Molli Knock  Recommendations for Outpatient Follow-up:  1) Please repeat CXR in 2 weeks to assess complete resolution of of the pneumothorax 2) Pacemaker wound check in Dr. Jenel Lucks office on 04/08/13 at 3pm   Allergies:  No Known Allergies   Discharge Medications:   Medication List         acetaminophen 325 MG tablet  Commonly known as:  TYLENOL  Take 650 mg by mouth every 4 (four) hours as needed. pain     aspirin EC 81 MG tablet  Take 81 mg by mouth daily.     bisacodyl 5 MG EC tablet  Generic drug:  bisacodyl  Take 5 mg by mouth at bedtime.     donepezil 10 MG tablet  Commonly known as:  ARICEPT  Take 10 mg by mouth daily.     eucerin cream  Apply 1 application topically daily.     HYDROcodone-acetaminophen 5-325 MG per tablet  Commonly known as:  NORCO/VICODIN  Take 1 tablet by mouth every 12 (twelve) hours as needed for pain.     LORazepam 0.5 MG tablet  Commonly known as:  ATIVAN  Take 1-1.5 tablets (0.5-0.75 mg total) by mouth every 6 (six) hours as needed for anxiety (and prior to showers). Anxiety     mirtazapine 15 MG tablet  Commonly known as:  REMERON  Take 1 tablet (15 mg total) by mouth at bedtime.     simvastatin 20 MG tablet  Commonly known as:  ZOCOR  Take 20 mg by mouth at bedtime.     venlafaxine XR 150 MG 24 hr capsule  Commonly known as:  EFFEXOR-XR  Take 1 capsule (150 mg total) by mouth daily.         Brief H and P: For complete details please  refer to admission H and P, but in brief Amy Wilkinson is a 79 y.o. female with past medical history of dementia, TIA, and hyperlipidemia. Much of the history is obtained from EMR given the patient has dementia and limited cooperation with examiner.Per ED physicians note, patient was brought to the hospital secondary to syncopal episode. Patient was noted to have bradycardia with pauses approximately 10 seconds associated with unresponsiveness.  Cardiology was consulted for further recommendations given Bradycardia associated with syncopal episodes. Troponin negative while in the ED. patient was admitted for further workup   Hospital Course:   79 y.o. patient was brought to the hospital secondary to syncopal episode. Patient was noted to have recurrent symptomatic bradycardia with pauses (approximately 10 seconds) associated with unresponsiveness. Cardiology was consulted and pt was subsequently transferred to CCU for closer monitoring. She underwent pacemaker implantation for symptomatic sinus mode dysfunction on 10/20. Post procedure on 10/21, she was noted to have hypoxia and increasing O2 requirements. Subsequent CXR revealed moderate left pneumonthorax. She was transferred to ICU and PCCM assumed care. Wayne chest tube was placed by pulmonary service, removed on 10/23. Followup chest x-ray post removal showed a 5%  pneumothorax. Per pulmonology, residual pneumothorax will likely be absorbed on its own. However on 04/05/2013, chest x-ray was repeated which showed left pneumothorax was worsened at 15%. Pulmonology was reconsulted who felt that it is likely technique related(AP vs PA), that pneumothorax appears worse and recommended repeating the chest x-ray. A chest x-ray on 10/28 showed persistent small left hydropneumothorax similar in size to yesterday's exam.  Bradycardia/SSS (sick sinus syndrome)  -resolved after pacemaker placement on 03/29/13, cardiology/EP service signed off  - pacemaker  wound check in Dr. Jenel Lucks office on 04/08/13 at 3pm, no further inpatient cardiac workup planned   Syncope  -due to symptomatic sinus node dysfunction , status post pacemaker   Pneumothorax, postprocedural  -pigtail catheter removed by [facility] 10/23. Followup CXR residual 5% pneumothorax, per Dr Molli Knock, will resolve over time. Chest x-ray on 10/27 showed a 15% pneumothorax, enlarging however patient was asymptomatic, no hypoxia, chest pain or shortness of breath. Pulmonology was reconsulted who felt that it is likely technique related(AP vs PA), that pneumothorax appears worse and recommended repeating the chest x-ray. A chest x-ray on 10/28 showed persistent small left hydropneumothorax similar in size to yesterday's exam.   Dementia with behavioral disturbance  Patient was resident of assisted-living facility before admission. PT and OT evaluation however recommended skilled nursing facility   History of TIA (transient ischemic attack)  - Continue aspirin and statin   Day of Discharge BP 139/59  Pulse 84  Temp(Src) 97.7 F (36.5 C) (Oral)  Resp 16  Ht 5\' 3"  (1.6 m)  Wt 60.7 kg (133 lb 13.1 oz)  BMI 23.71 kg/m2  SpO2 95%  Physical Exam: General: Alert and awake oriented x3 not in any acute distress. CVS: S1-S2 clear no murmur rubs or gallops Chest: clear to auscultation bilaterally, no wheezing rales or rhonchi Abdomen: soft nontender, nondistended, normal bowel sounds Extremities: no cyanosis, clubbing or edema noted bilaterally Neuro: Cranial nerves II-XII intact, no focal neurological deficits   The results of significant diagnostics from this hospitalization (including imaging, microbiology, ancillary and laboratory) are listed below for reference.    LAB RESULTS: Basic Metabolic Panel:  Recent Labs Lab 04/01/13 0545 04/03/13 0340  NA 134* 139  K 3.7 3.5  CL 97 101  CO2 26 29  GLUCOSE 105* 138*  BUN 11 13  CREATININE 0.49* 0.87  CALCIUM 8.9 8.4  MG 2.1  --    PHOS 3.0  --    Liver Function Tests: No results found for this basename: AST, ALT, ALKPHOS, BILITOT, PROT, ALBUMIN,  in the last 168 hours No results found for this basename: LIPASE, AMYLASE,  in the last 168 hours No results found for this basename: AMMONIA,  in the last 168 hours CBC:  Recent Labs Lab 04/01/13 0545  WBC 8.5  HGB 12.9  HCT 38.1  MCV 84.7  PLT 322   Cardiac Enzymes: No results found for this basename: CKTOTAL, CKMB, CKMBINDEX, TROPONINI,  in the last 168 hours BNP: No components found with this basename: POCBNP,  CBG: No results found for this basename: GLUCAP,  in the last 168 hours  Significant Diagnostic Studies:  Dg Chest Portable 1 View  03/28/2013   CLINICAL DATA:  Status post code blue:  Emesis, bradycardia  EXAM: PORTABLE CHEST - 1 VIEW  COMPARISON:  Prior chest x-ray 01/09/2012; prior CT chest 03/31/2012  FINDINGS: External defibrillator pads project over the left chest limiting evaluation of the left base. The cardiac and mediastinal contours are stable and within normal limits.  No focal airspace consolidation. Chronic bronchitic changes and prominence of the interstitial markings are similar compared to prior. Atherosclerotic calcifications are noted in the transverse aorta. No pneumothorax. Low inspiratory volumes with mild right basilar atelectasis and probable left basilar atelectasis. No acute osseous abnormality.  IMPRESSION: No definite acute active disease. Limited evaluation of the left base secondary to obscuring external defibrillator pads.  Slightly low inspiratory volumes with mild right basilar atelectasis.   Electronically Signed   By: Malachy Moan M.D.   On: 03/28/2013 11:38    2D ECHO: 10/20  Study Conclusions  - Left ventricle: The cavity size was normal. Wall thickness was normal. Systolic function was normal. The estimated ejection fraction was in the range of 55% to 60%. Wall motion was normal; there were no regional wall  motion abnormalities. Doppler parameters are consistent with abnormal left ventricular relaxation (grade 1 diastolic dysfunction). - Aortic valve: Trivial regurgitation. - Mitral valve: Calcified annulus. Mildly thickened leaflets . - Atrial septum: No defect or patent foramen ovale was identified.   Disposition and Follow-up:     Discharge Orders   Future Appointments Provider Department Dept Phone   04/08/2013 3:00 PM [facility] Office [phone]   Future Orders Complete By Expires   Diet - low sodium heart healthy  As directed    Increase activity slowly  As directed        DISPOSITION: Skilled nursing facility  DIET: Heart healthy diet ACTIVITY: As tolerated TESTS THAT NEED FOLLOW-UP Chest x-ray in 2 weeks  DISCHARGE FOLLOW-UP Follow-up Information   Follow up with [facility] Office On 04/08/2013. (At 3:00 PM for wound check)    Specialty:  Cardiology   Contact information:   [address], [address] [place] [address] [phone]      Follow up with Hillis Range, MD In 3 months. (Our office will mail letter)    Specialty:  Cardiology   Contact information:   [address] [place] [address] [phone]       Time spent on Discharge: 45 mins  Signed:   RAI,RIPUDEEP M.D. [facility] 04/06/2013, 10:27 AM Pager: [phone]

## 2013-04-08 ENCOUNTER — Ambulatory Visit: Payer: Medicare Other

## 2013-04-18 ENCOUNTER — Encounter (HOSPITAL_COMMUNITY): Payer: Self-pay | Admitting: *Deleted

## 2013-04-19 ENCOUNTER — Encounter: Payer: Self-pay | Admitting: Internal Medicine

## 2013-04-20 ENCOUNTER — Encounter: Payer: Self-pay | Admitting: Internal Medicine

## 2013-11-05 ENCOUNTER — Ambulatory Visit (INDEPENDENT_AMBULATORY_CARE_PROVIDER_SITE_OTHER): Payer: Medicare Other | Admitting: *Deleted

## 2013-11-05 ENCOUNTER — Encounter: Payer: Self-pay | Admitting: Internal Medicine

## 2013-11-05 ENCOUNTER — Emergency Department (HOSPITAL_COMMUNITY): Payer: Medicare Other

## 2013-11-05 ENCOUNTER — Emergency Department (HOSPITAL_COMMUNITY)
Admission: EM | Admit: 2013-11-05 | Discharge: 2013-11-05 | Disposition: A | Discharge status: Home / Self Care | Payer: Medicare Other | Attending: Emergency Medicine | Admitting: Emergency Medicine

## 2013-11-05 ENCOUNTER — Encounter (HOSPITAL_COMMUNITY): Payer: Self-pay | Admitting: Emergency Medicine

## 2013-11-05 DIAGNOSIS — S0990XA Unspecified injury of head, initial encounter: Secondary | ICD-10-CM | POA: Insufficient documentation

## 2013-11-05 DIAGNOSIS — F0390 Unspecified dementia without behavioral disturbance: Secondary | ICD-10-CM | POA: Insufficient documentation

## 2013-11-05 DIAGNOSIS — Z7982 Long term (current) use of aspirin: Secondary | ICD-10-CM | POA: Insufficient documentation

## 2013-11-05 DIAGNOSIS — Y9389 Activity, other specified: Secondary | ICD-10-CM | POA: Insufficient documentation

## 2013-11-05 DIAGNOSIS — I495 Sick sinus syndrome: Secondary | ICD-10-CM

## 2013-11-05 DIAGNOSIS — S0083XA Contusion of other part of head, initial encounter: Secondary | ICD-10-CM | POA: Insufficient documentation

## 2013-11-05 DIAGNOSIS — Z950 Presence of cardiac pacemaker: Secondary | ICD-10-CM | POA: Insufficient documentation

## 2013-11-05 DIAGNOSIS — W19XXXA Unspecified fall, initial encounter: Secondary | ICD-10-CM

## 2013-11-05 DIAGNOSIS — Z8673 Personal history of transient ischemic attack (TIA), and cerebral infarction without residual deficits: Secondary | ICD-10-CM | POA: Insufficient documentation

## 2013-11-05 DIAGNOSIS — Z8679 Personal history of other diseases of the circulatory system: Secondary | ICD-10-CM | POA: Insufficient documentation

## 2013-11-05 DIAGNOSIS — Y9289 Other specified places as the place of occurrence of the external cause: Secondary | ICD-10-CM | POA: Insufficient documentation

## 2013-11-05 DIAGNOSIS — S1093XA Contusion of unspecified part of neck, initial encounter: Secondary | ICD-10-CM

## 2013-11-05 DIAGNOSIS — IMO0002 Reserved for concepts with insufficient information to code with codable children: Secondary | ICD-10-CM | POA: Insufficient documentation

## 2013-11-05 DIAGNOSIS — S0003XA Contusion of scalp, initial encounter: Secondary | ICD-10-CM | POA: Insufficient documentation

## 2013-11-05 DIAGNOSIS — E782 Mixed hyperlipidemia: Secondary | ICD-10-CM | POA: Insufficient documentation

## 2013-11-05 DIAGNOSIS — H409 Unspecified glaucoma: Secondary | ICD-10-CM | POA: Insufficient documentation

## 2013-11-05 DIAGNOSIS — I498 Other specified cardiac arrhythmias: Secondary | ICD-10-CM

## 2013-11-05 DIAGNOSIS — W1809XA Striking against other object with subsequent fall, initial encounter: Secondary | ICD-10-CM | POA: Insufficient documentation

## 2013-11-05 DIAGNOSIS — R001 Bradycardia, unspecified: Secondary | ICD-10-CM

## 2013-11-05 LAB — CBC WITH DIFFERENTIAL/PLATELET
BASOS ABS: 0.1 10*3/uL (ref 0.0–0.1)
Basophils Relative: 1 % (ref 0–1)
Eosinophils Absolute: 0.2 10*3/uL (ref 0.0–0.7)
Eosinophils Relative: 2 % (ref 0–5)
HEMATOCRIT: 40.2 % (ref 36.0–46.0)
Hemoglobin: 13.1 g/dL (ref 12.0–15.0)
LYMPHS PCT: 20 % (ref 12–46)
Lymphs Abs: 1.5 10*3/uL (ref 0.7–4.0)
MCH: 28.3 pg (ref 26.0–34.0)
MCHC: 32.6 g/dL (ref 30.0–36.0)
MCV: 86.8 fL (ref 78.0–100.0)
MONO ABS: 0.7 10*3/uL (ref 0.1–1.0)
MONOS PCT: 10 % (ref 3–12)
Neutro Abs: 5.1 10*3/uL (ref 1.7–7.7)
Neutrophils Relative %: 67 % (ref 43–77)
Platelets: 233 10*3/uL (ref 150–400)
RBC: 4.63 MIL/uL (ref 3.87–5.11)
RDW: 14.9 % (ref 11.5–15.5)
WBC: 7.6 10*3/uL (ref 4.0–10.5)

## 2013-11-05 LAB — BASIC METABOLIC PANEL
BUN: 15 mg/dL (ref 6–23)
CHLORIDE: 103 meq/L (ref 96–112)
CO2: 28 meq/L (ref 19–32)
CREATININE: 0.69 mg/dL (ref 0.50–1.10)
Calcium: 9.1 mg/dL (ref 8.4–10.5)
GFR calc Af Amer: 86 mL/min — ABNORMAL LOW (ref >90)
GFR calc non Af Amer: 74 mL/min — ABNORMAL LOW (ref >90)
GLUCOSE: 92 mg/dL (ref 70–99)
Potassium: 4.2 mEq/L (ref 3.7–5.3)
Sodium: 142 mEq/L (ref 137–147)

## 2013-11-05 LAB — URINALYSIS, ROUTINE W REFLEX MICROSCOPIC
Bilirubin Urine: NEGATIVE
Glucose, UA: NEGATIVE mg/dL
HGB URINE DIPSTICK: NEGATIVE
Ketones, ur: NEGATIVE mg/dL
Leukocytes, UA: NEGATIVE
Nitrite: NEGATIVE
PH: 7 (ref 5.0–8.0)
Protein, ur: NEGATIVE mg/dL
SPECIFIC GRAVITY, URINE: 1.011 (ref 1.005–1.030)
UROBILINOGEN UA: 0.2 mg/dL (ref 0.0–1.0)

## 2013-11-05 LAB — I-STAT TROPONIN, ED: TROPONIN I, POC: 0 ng/mL (ref 0.00–0.08)

## 2013-11-05 NOTE — ED Provider Notes (Signed)
CSN: [id number]     Arrival date & time 11/05/13  [id number] History   First MD Initiated Contact with Patient 11/05/13 [phone]     Chief Complaint  Patient presents with  . Fall     (Consider location/radiation/quality/duration/timing/severity/associated sxs/prior Treatment) Patient is a 80 y.o. female presenting with fall. The history is provided by the patient and medical records.  Fall  LEVEL 5 CAVEAT:  DEMENTIA This is a 80 year old female with past medical history significant for dementia, bradycardia status post [employer] pacemaker placement, TIA, presenting to the ED for a witnessed fall [facility].  Patient states she was ambulating with her walker when she fell and hit her head and forehead. Patient denies any dizziness or lightheadedness prior to fall. She states she is unsure why she fell. She denies loss of consciousness.  Patient has not attempted to ambulate since fall.  She denies any current headache, dizziness, unilateral weakness, changes in speech, or ataxia.  Patient is not currently on any anticoagulants. She denies any chest pain, palpitations, or shortness of breath.  Tetanus UTD (given April 2014 during ED visit on review of medical records).  VS stable on arrival.  Past Medical History  Diagnosis Date  . Dementia   . Mixed hyperlipidemia   . Glaucoma   . TIA (transient ischemic attack)   . Symptomatic bradycardia 03/2013    s/p STJ dual chamber pacemaker insertion by Dr Johney FrameAllred   Past Surgical History  Procedure Laterality Date  . Abdominal hysterectomy    . Cholecystectomy    . Pacemaker insertion  03/29/2013    STJ Assurity dual chamber pacemaker implanted for symptomatic bradycardia by Dr Johney FrameAllred   No family history on file. History  Substance Use Topics  . Smoking status: Never Smoker   . Smokeless tobacco: Not on file  . Alcohol Use: No   OB History   Grav Para Term Preterm Abortions TAB SAB Ect Mult Living                  Review of Systems  Unable to perform ROS: Dementia      Allergies  Review of patient's allergies indicates no known allergies.  Home Medications   Prior to Admission medications   Medication Sig Start Date End Date Taking? Authorizing Provider  acetaminophen (TYLENOL) 325 MG tablet Take 650 mg by mouth every 4 (four) hours as needed. pain   Yes Historical Provider, MD  aspirin EC 81 MG tablet Take 81 mg by mouth daily.   Yes Historical Provider, MD  bisacodyl (BISACODYL) 5 MG EC tablet Take 5 mg by mouth at bedtime.   Yes Historical Provider, MD  donepezil (ARICEPT) 10 MG tablet Take 10 mg by mouth daily.    Yes Historical Provider, MD  LORazepam (ATIVAN) 0.5 MG tablet Take 1-1.5 tablets (0.5-0.75 mg total) by mouth every 6 (six) hours as needed for anxiety (and prior to showers). Anxiety 04/05/13  Yes Ripudeep Jenna LuoK Rai, MD  mirtazapine (REMERON) 15 MG tablet Take 1 tablet (15 mg total) by mouth at bedtime. 04/05/13  Yes Ripudeep Jenna LuoK Rai, MD  simvastatin (ZOCOR) 20 MG tablet Take 30 mg by mouth at bedtime.    Yes Historical Provider, MD  venlafaxine XR (EFFEXOR-XR) 150 MG 24 hr capsule Take 1 capsule (150 mg total) by mouth daily. 04/05/13  Yes Ripudeep K Rai, MD   Pulse 70  Temp(Src) 97.5 F (36.4 C) (Oral)  Resp 14  SpO2 96%  Physical Exam  Nursing note and vitals reviewed. Constitutional: She is oriented to person, place, and time. She appears well-developed and well-nourished.  HENT:  Head: Normocephalic. Head is with contusion. Head is without laceration.  Mouth/Throat: Oropharynx is clear and moist.  Small hematoma and bruising to right for head, localized tenderness to palpation, no laceration or active bleeding  Eyes: Conjunctivae and EOM are normal. Pupils are equal, round, and reactive to light.  Neck: Normal range of motion.  Cardiovascular: Normal rate, regular rhythm and normal heart sounds.   Pulmonary/Chest: Effort normal and breath sounds normal. No respiratory  distress. She has no wheezes.  Abdominal: Soft. Bowel sounds are normal.  Musculoskeletal: Normal range of motion.       Cervical back: Normal.  Small abrasion to left elbow; no swelling or gross bony deformity; full ROM maintained without difficulty Hips non-tender, no noted pelvic instability or leg shortening; DP pulses intact bilaterally  Neurological: She is alert and oriented to person, place, and time.  Awake, alert; oriented to self and place but not year or month; following commands when prompted; equal strength of UE and LE bilaterally; moving extremities with purposeful movements; sensation to light touch intact diffusely throughout all 4 extremities; no facial asymmetry noted  Skin: Skin is warm and dry.  Psychiatric: She has a normal mood and affect.    ED Course  Procedures (including critical care time) Labs Review Labs Reviewed  BASIC METABOLIC PANEL - Abnormal; Notable for the following:    GFR calc non Af Amer 74 (*)    GFR calc Af Amer 86 (*)    All other components within normal limits  CBC WITH DIFFERENTIAL  URINALYSIS, ROUTINE W REFLEX MICROSCOPIC  I-STAT TROPOININ, ED    Imaging Review Dg Chest 1 View  11/05/2013   CLINICAL DATA:  Fall.  Chest pain.  EXAM: CHEST - 1 VIEW  COMPARISON:  04/06/2013  FINDINGS: Mild pleural thickening noted in lateral left lung base, however previously seen left pneumothorax is no longer visualized. Lungs are otherwise clear. Small hiatal hernia noted. Heart size is stable. Dual lead transvenous pacemaker remains in appropriate position.  IMPRESSION: No acute findings.  Small hiatal hernia and left basilar pleural thickening.   Electronically Signed   By: Myles Rosenthal M.D.   On: 11/05/2013 10:22   Dg Pelvis 1-2 Views  11/05/2013   CLINICAL DATA:  Fall with pelvic pain.  EXAM: PELVIS - 1-2 VIEW  COMPARISON:  None.  FINDINGS: Both femoral heads are located. Moderate to marked osteoarthritis involves the hips bilaterally. Joint space  narrowing and osteophyte formation. Mild osteopenia. Sacroiliac joints are symmetric. No acute fracture.  IMPRESSION: Degenerative change, without acute osseous finding.   Electronically Signed   By: Jeronimo Greaves M.D.   On: 11/05/2013 10:23   Dg Elbow Complete Left  11/05/2013   CLINICAL DATA:  Trauma and pain.  EXAM: LEFT ELBOW - COMPLETE 3+ VIEW  COMPARISON:  None.  FINDINGS: No acute fracture or dislocation. Suspect soft tissue injury posterior to the distal humerus. No joint effusion.  IMPRESSION: No acute osseous abnormality.   Electronically Signed   By: Jeronimo Greaves M.D.   On: 11/05/2013 10:21   Ct Head Wo Contrast  11/05/2013   CLINICAL DATA:  Pain post trauma  EXAM: CT HEAD WITHOUT CONTRAST  CT CERVICAL SPINE WITHOUT CONTRAST  TECHNIQUE: Multidetector CT imaging of the head and cervical spine was performed following the standard protocol without intravenous contrast. Multiplanar CT image  reconstructions of the cervical spine were also generated.  COMPARISON:  March 19, 2012  FINDINGS: CT HEAD FINDINGS  Mild generalized atrophy is stable. There is no mass, hemorrhage, extra-axial fluid collection, or midline shift. There is evidence of a prior infarct in the right frontal region, stable. There is patchy small vessel disease in the centra semiovale bilaterally, stable. There is no new gray-white compartment lesion. No evidence of acute infarct. Bony calvarium appears intact. The mastoid air cells are clear.  CT CERVICAL SPINE FINDINGS  There is no fracture or spondylolisthesis. Prevertebral soft tissues and predental space regions are normal. There is moderately severe disc space narrowing at C5-6. There is moderate disc space narrowing at C3-4 and C6-7. There is facet hypertrophy at multiple levels bilaterally. There is no frank disc extrusion or stenosis. There is exit foraminal narrowing at C3-4 bilaterally, C4-5 on the right, C5-6 bilaterally, and C6-7 on the right due to bony hypertrophy.  There  are foci of carotid artery calcification bilaterally. The there is inhomogeneity throughout the attenuation of the thyroid. A small calcification is noted in the left thyroid region.  IMPRESSION: CT head: Atrophy with patchy periventricular small vessel disease and prior right frontal lobe infarct. No intracranial mass, hemorrhage, or acute appearing infarct, or extra-axial fluid.  CT cervical spine: Multilevel osteoarthritic change. No fracture or spondylolisthesis.  There is calcification in both carotid arteries.  Inhomogeneous appearance of the thyroid. This finding may warrant nonemergent thyroid ultrasound to further assess.   Electronically Signed   By: Bretta Bang M.D.   On: 11/05/2013 10:22   Ct Cervical Spine Wo Contrast  11/05/2013   CLINICAL DATA:  Pain post trauma  EXAM: CT HEAD WITHOUT CONTRAST  CT CERVICAL SPINE WITHOUT CONTRAST  TECHNIQUE: Multidetector CT imaging of the head and cervical spine was performed following the standard protocol without intravenous contrast. Multiplanar CT image reconstructions of the cervical spine were also generated.  COMPARISON:  March 19, 2012  FINDINGS: CT HEAD FINDINGS  Mild generalized atrophy is stable. There is no mass, hemorrhage, extra-axial fluid collection, or midline shift. There is evidence of a prior infarct in the right frontal region, stable. There is patchy small vessel disease in the centra semiovale bilaterally, stable. There is no new gray-white compartment lesion. No evidence of acute infarct. Bony calvarium appears intact. The mastoid air cells are clear.  CT CERVICAL SPINE FINDINGS  There is no fracture or spondylolisthesis. Prevertebral soft tissues and predental space regions are normal. There is moderately severe disc space narrowing at C5-6. There is moderate disc space narrowing at C3-4 and C6-7. There is facet hypertrophy at multiple levels bilaterally. There is no frank disc extrusion or stenosis. There is exit foraminal  narrowing at C3-4 bilaterally, C4-5 on the right, C5-6 bilaterally, and C6-7 on the right due to bony hypertrophy.  There are foci of carotid artery calcification bilaterally. The there is inhomogeneity throughout the attenuation of the thyroid. A small calcification is noted in the left thyroid region.  IMPRESSION: CT head: Atrophy with patchy periventricular small vessel disease and prior right frontal lobe infarct. No intracranial mass, hemorrhage, or acute appearing infarct, or extra-axial fluid.  CT cervical spine: Multilevel osteoarthritic change. No fracture or spondylolisthesis.  There is calcification in both carotid arteries.  Inhomogeneous appearance of the thyroid. This finding may warrant nonemergent thyroid ultrasound to further assess.   Electronically Signed   By: Bretta Bang M.D.   On: 11/05/2013 10:22     EKG Interpretation  Date/Time:  Friday Nov 05 2013 09:25:02 EDT Ventricular Rate:  70 PR Interval:  140 QRS Duration: 88 QT Interval:  418 QTC Calculation: 451 R Axis:   10 Text Interpretation:  Normal sinus rhythm nonspecific t wave abnormality  v1-v3 Confirmed by RAY MD, Duwayne Heck ([id number]) on 11/05/2013 9:41:38 AM      MDM   Final diagnoses:  Fall   80 year old female s/p fall with head injury but no LOC.  Not on anti-coags at this time.  Does have bruising to right forehead with hematoma present.   Small skin tear to left elbow, tetanus UTD.  Denies dizziness or syncope prior to fall.  Will obtain CT head, CS, CXR, DG pelvis for imaging.  Labs will be obtain and pacemaker interrogated as pt is not entirely sure why she fell.  VS stable at this time.  EKG sinus rhythm without ischemic change.  Troponin is negative. Labs are reassuring. UA without signs of infection. Imaging negative for acute abnormalities or injuries.  Patient's pacemaker was interrogated, no recent events, but was discovered that device has not been interrogated since implantation in October of  2014. [employer] representative has adjusted her settings in the ED and has arranged for regular FU at her facility.  Pt has remained stable during her ED stay, neurologic exam unchanged and will be discharged home with close PCP FU.  [facility] called and will transport back to facility.  Garlon Hatchet, PA-C 11/05/13 1433  Garlon Hatchet, PA-C 11/05/13 1437  Garlon Hatchet, PA-C 11/05/13 1440

## 2013-11-05 NOTE — Discharge Instructions (Signed)
Work-up today was negative, no new injuries. [employer] representatives have arranged for outpatient device interrogation at your facility. Continue using walker when ambulating to prevent further falls. Return to the ED for new concerns.

## 2013-11-05 NOTE — ED Notes (Signed)
Pt with c/o fall while ambulating with her walker at [facility]. Hit head and c/o forehead pain. No LOC, hematoma or bruises. Vitals are stable. HX of Dementia, Alzh, and CVA.

## 2013-11-05 NOTE — ED Notes (Signed)
Patient transported to CT 

## 2013-11-05 NOTE — ED Notes (Signed)
Attempted to call [facility] with no answer. Will contact [facility] to transport back to facility.

## 2013-11-05 NOTE — ED Notes (Signed)
Pacemaker being interrogated

## 2013-11-05 NOTE — Progress Notes (Signed)
Remote pacemaker transmission.   

## 2013-11-08 NOTE — ED Provider Notes (Signed)
80 y.o female sent to ED from assisted living for fall.  She is unable to give history due to dementia.  NO focal neurologic deficits noted. No acute traumatic changes found on head ct.  History/physical exam/procedure(s) were performed by non-physician practitioner and as supervising physician I was immediately available for consultation/collaboration. I have reviewed all notes and am in agreement with care and plan.  I performed a history and physical examination of Amy Wilkinson and discussed her management with Ms. Sanders.  I agree with the history, physical, assessment, and plan of care, with the following exceptions: None  I was present for the following procedures: None Time Spent in Critical Care of the patient: None Time spent in discussions with the patient and family:7  Kylor Valverde S Carrie Schoonmaker    Hilario Quarry, MD 11/08/13 [phone]

## 2013-11-22 NOTE — Addendum Note (Signed)
Addended by: Donalynn FurlongMCNEILL, Caterina Racine on: 11/22/2013 01:44 PM   Modules accepted: Level of Service

## 2013-12-15 ENCOUNTER — Encounter: Payer: Self-pay | Admitting: *Deleted

## 2014-05-19 ENCOUNTER — Encounter (HOSPITAL_COMMUNITY): Payer: Self-pay | Admitting: Internal Medicine

## 2014-10-09 DEATH — deceased

## 2014-10-31 IMAGING — CR DG ELBOW COMPLETE 3+V*L*
3 series · 3 of 3 positions shown · non-contrast
Comparison: None.

CLINICAL DATA: Trauma and pain.

EXAM:
LEFT ELBOW - COMPLETE 3+ VIEW

[t elbow (1 of 3)]
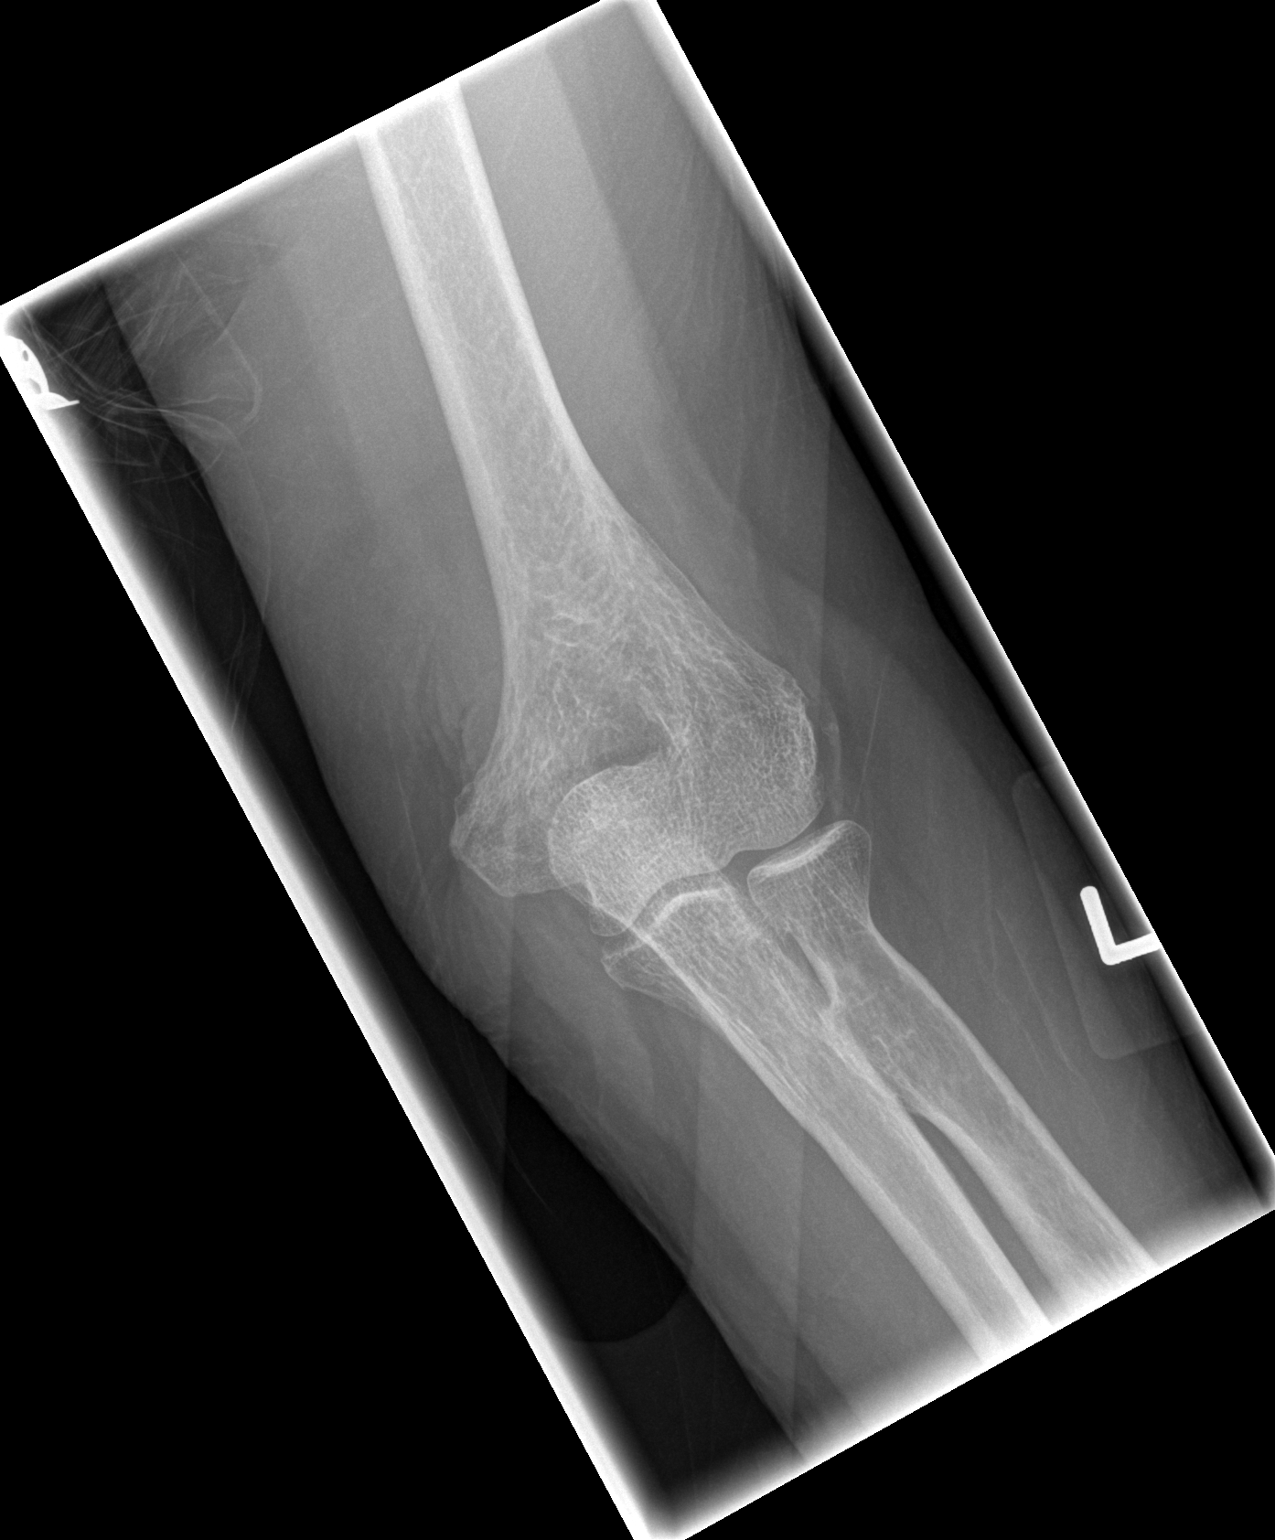

[t elbow (2 of 3)]
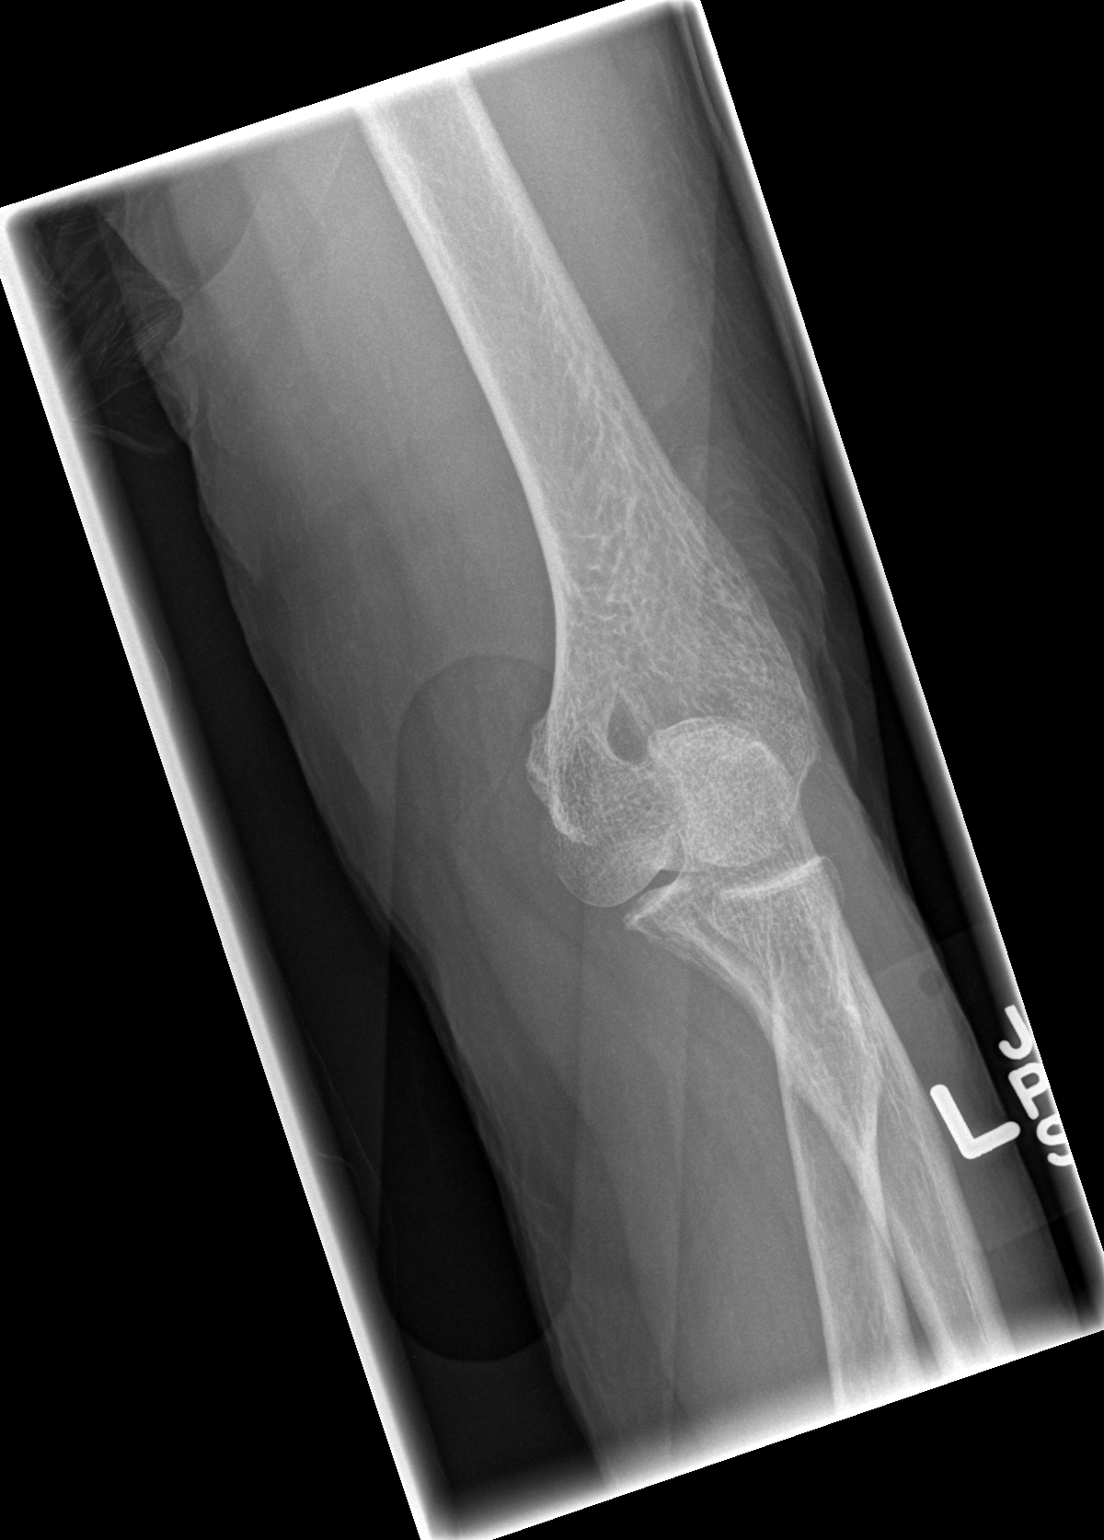

[t elbow (3 of 3)]
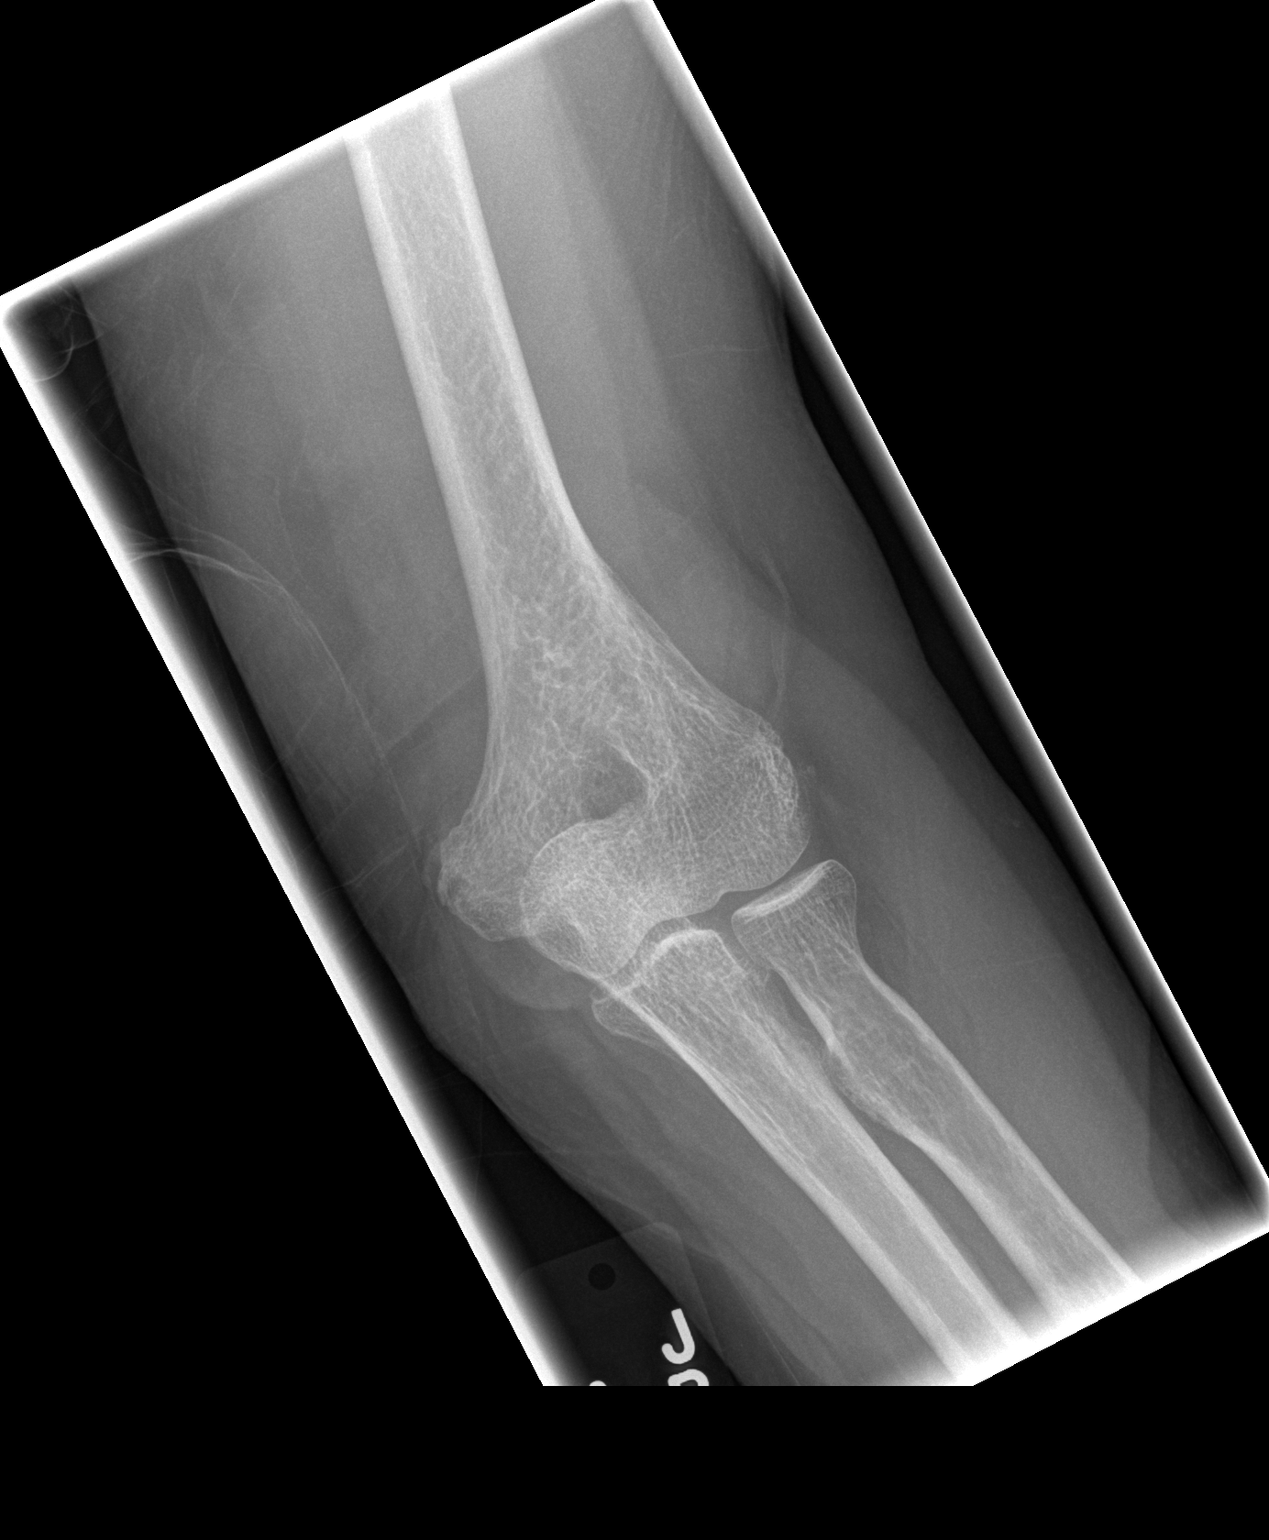

[3 of 3 positions shown; findings below may reference images not displayed]

FINDINGS: No acute fracture or dislocation. Suspect soft tissue injury
posterior to the distal humerus. No joint effusion.
IMPRESSION: No acute osseous abnormality.

## 2014-10-31 IMAGING — CR DG PELVIS 1-2V
1 series · 1 of 1 positions shown · non-contrast
Comparison: None.

CLINICAL DATA: Fall with pelvic pain.

EXAM:
PELVIS - 1-2 VIEW

[t pelvis a.p.]
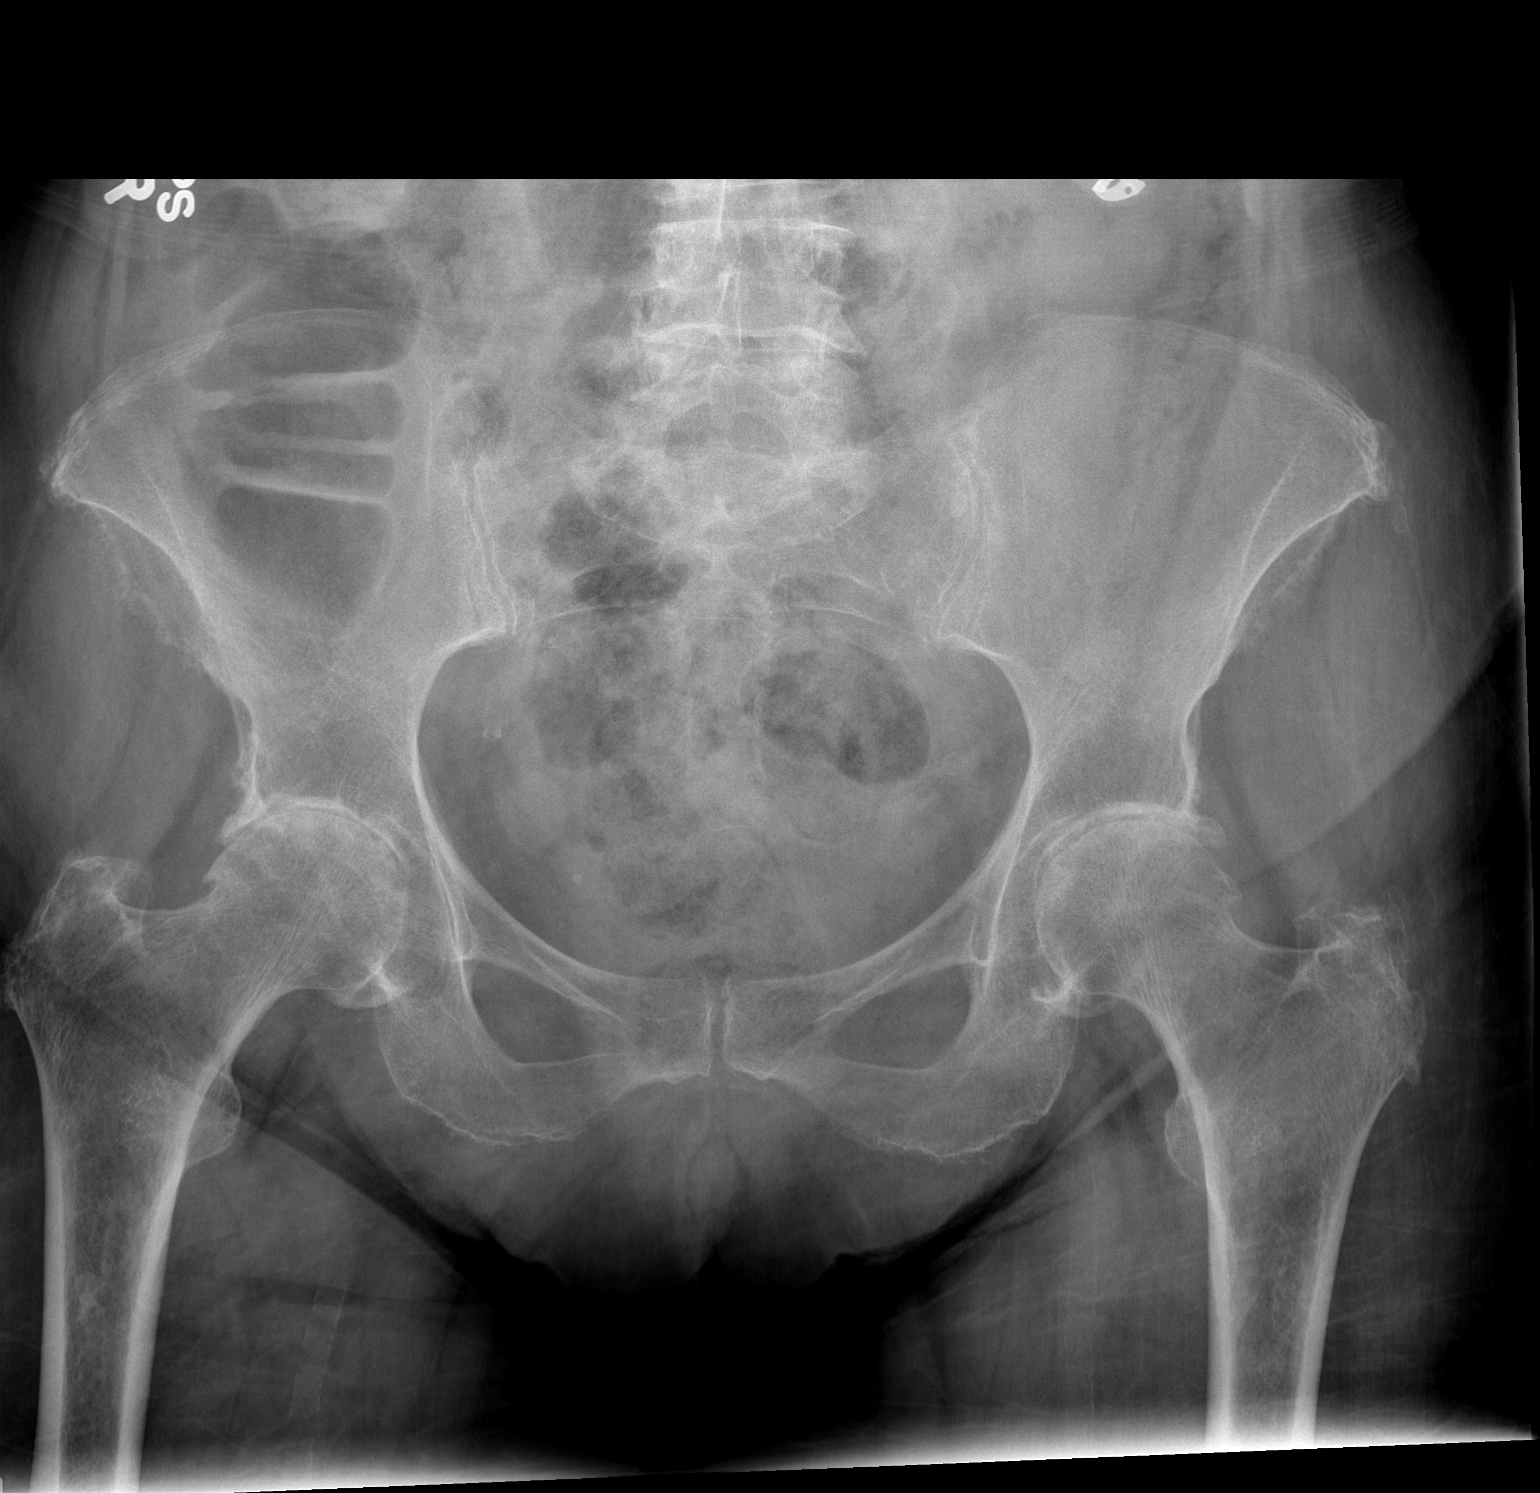

[1 of 1 positions shown; findings below may reference images not displayed]

FINDINGS: Both femoral heads are located. Moderate to marked osteoarthritis
involves the hips bilaterally. Joint space narrowing and osteophyte
formation. Mild osteopenia. Sacroiliac joints are symmetric. No
acute fracture.
IMPRESSION: Degenerative change, without acute osseous finding.
